# Patient Record
Sex: Female | Born: 1975 | Race: White | Hispanic: No | Marital: Married | Smoking: Never smoker
Health system: Southern US, Community
[De-identification: ages and names within clinical notes are randomized; demographics above are authoritative.]

## PROBLEM LIST (undated history)

## (undated) DIAGNOSIS — E669 Obesity, unspecified: Secondary | ICD-10-CM

## (undated) DIAGNOSIS — R0602 Shortness of breath: Secondary | ICD-10-CM

## (undated) DIAGNOSIS — R002 Palpitations: Secondary | ICD-10-CM

## (undated) DIAGNOSIS — I2541 Coronary artery aneurysm: Secondary | ICD-10-CM

## (undated) DIAGNOSIS — IMO0002 Reserved for concepts with insufficient information to code with codable children: Secondary | ICD-10-CM

## (undated) DIAGNOSIS — I071 Rheumatic tricuspid insufficiency: Secondary | ICD-10-CM

## (undated) DIAGNOSIS — I10 Essential (primary) hypertension: Secondary | ICD-10-CM

## (undated) DIAGNOSIS — K5900 Constipation, unspecified: Secondary | ICD-10-CM

## (undated) DIAGNOSIS — I517 Cardiomegaly: Secondary | ICD-10-CM

## (undated) DIAGNOSIS — R300 Dysuria: Secondary | ICD-10-CM

## (undated) DIAGNOSIS — I5032 Chronic diastolic (congestive) heart failure: Secondary | ICD-10-CM

## (undated) DIAGNOSIS — R9430 Abnormal result of cardiovascular function study, unspecified: Secondary | ICD-10-CM

## (undated) DIAGNOSIS — I272 Pulmonary hypertension, unspecified: Secondary | ICD-10-CM

## (undated) DIAGNOSIS — R635 Abnormal weight gain: Secondary | ICD-10-CM

## (undated) DIAGNOSIS — N390 Urinary tract infection, site not specified: Secondary | ICD-10-CM

## (undated) DIAGNOSIS — R42 Dizziness and giddiness: Secondary | ICD-10-CM

## (undated) DIAGNOSIS — I253 Aneurysm of heart: Secondary | ICD-10-CM

## (undated) DIAGNOSIS — F419 Anxiety disorder, unspecified: Secondary | ICD-10-CM

## (undated) HISTORY — DX: Chronic diastolic (congestive) heart failure: I50.32

## (undated) HISTORY — DX: Obesity, unspecified: E66.9

## (undated) HISTORY — PX: TUBAL LIGATION: SHX77

## (undated) HISTORY — DX: Abnormal result of cardiovascular function study, unspecified: R94.30

## (undated) HISTORY — DX: Anxiety disorder, unspecified: F41.9

## (undated) HISTORY — DX: Constipation, unspecified: K59.00

## (undated) HISTORY — DX: Rheumatic tricuspid insufficiency: I07.1

## (undated) HISTORY — DX: Coronary artery aneurysm: I25.41

## (undated) HISTORY — DX: Dizziness and giddiness: R42

## (undated) HISTORY — DX: Urinary tract infection, site not specified: N39.0

## (undated) HISTORY — DX: Pulmonary hypertension, unspecified: I27.20

## (undated) HISTORY — DX: Aneurysm of heart: I25.3

## (undated) HISTORY — DX: Abnormal weight gain: R63.5

## (undated) HISTORY — DX: Shortness of breath: R06.02

## (undated) HISTORY — DX: Reserved for concepts with insufficient information to code with codable children: IMO0002

## (undated) HISTORY — DX: Dysuria: R30.0

## (undated) HISTORY — DX: Cardiomegaly: I51.7

## (undated) HISTORY — DX: Essential (primary) hypertension: I10

## (undated) HISTORY — DX: Palpitations: R00.2

---

## 2007-06-23 ENCOUNTER — Ambulatory Visit: Payer: Self-pay | Admitting: Cardiology

## 2007-07-16 ENCOUNTER — Ambulatory Visit: Payer: Self-pay | Admitting: Cardiology

## 2007-08-11 ENCOUNTER — Ambulatory Visit: Payer: Self-pay | Admitting: Cardiology

## 2007-09-15 ENCOUNTER — Ambulatory Visit: Payer: Self-pay | Admitting: Cardiology

## 2007-11-10 ENCOUNTER — Ambulatory Visit: Payer: Self-pay | Admitting: Cardiology

## 2007-11-10 ENCOUNTER — Encounter: Payer: Self-pay | Admitting: Physician Assistant

## 2007-11-12 ENCOUNTER — Ambulatory Visit: Payer: Self-pay | Admitting: Cardiology

## 2007-11-19 ENCOUNTER — Ambulatory Visit: Payer: Self-pay | Admitting: Cardiology

## 2007-12-22 ENCOUNTER — Ambulatory Visit: Payer: Self-pay | Admitting: Cardiology

## 2007-12-29 ENCOUNTER — Encounter: Payer: Self-pay | Admitting: Cardiology

## 2008-01-15 ENCOUNTER — Ambulatory Visit: Payer: Self-pay | Admitting: Cardiology

## 2008-01-22 ENCOUNTER — Ambulatory Visit: Payer: Self-pay | Admitting: Cardiology

## 2008-01-22 ENCOUNTER — Encounter: Payer: Self-pay | Admitting: Cardiology

## 2008-02-04 ENCOUNTER — Ambulatory Visit: Payer: Self-pay | Admitting: Cardiology

## 2008-02-04 ENCOUNTER — Encounter: Payer: Self-pay | Admitting: Cardiology

## 2008-02-25 ENCOUNTER — Ambulatory Visit: Payer: Self-pay | Admitting: Cardiology

## 2008-10-24 ENCOUNTER — Encounter: Payer: Self-pay | Admitting: Cardiology

## 2009-03-30 ENCOUNTER — Encounter (INDEPENDENT_AMBULATORY_CARE_PROVIDER_SITE_OTHER): Payer: Self-pay | Admitting: *Deleted

## 2009-06-13 ENCOUNTER — Encounter: Payer: Self-pay | Admitting: Cardiology

## 2009-08-30 ENCOUNTER — Encounter (INDEPENDENT_AMBULATORY_CARE_PROVIDER_SITE_OTHER): Payer: Self-pay | Admitting: *Deleted

## 2009-08-30 ENCOUNTER — Ambulatory Visit: Payer: Self-pay | Admitting: Cardiology

## 2009-08-31 ENCOUNTER — Ambulatory Visit: Payer: Self-pay | Admitting: Cardiology

## 2009-09-01 ENCOUNTER — Ambulatory Visit: Payer: Self-pay | Admitting: Cardiology

## 2009-09-01 ENCOUNTER — Encounter (INDEPENDENT_AMBULATORY_CARE_PROVIDER_SITE_OTHER): Payer: Self-pay | Admitting: *Deleted

## 2009-09-01 ENCOUNTER — Telehealth (INDEPENDENT_AMBULATORY_CARE_PROVIDER_SITE_OTHER): Payer: Self-pay | Admitting: *Deleted

## 2009-09-04 ENCOUNTER — Encounter: Payer: Self-pay | Admitting: Cardiology

## 2009-09-05 ENCOUNTER — Ambulatory Visit: Payer: Self-pay | Admitting: Cardiovascular Disease

## 2009-09-05 ENCOUNTER — Encounter: Payer: Self-pay | Admitting: Cardiology

## 2009-09-05 ENCOUNTER — Ambulatory Visit (HOSPITAL_COMMUNITY): Admission: RE | Admit: 2009-09-05 | Discharge: 2009-09-05 | Payer: Self-pay | Admitting: Cardiology

## 2009-09-06 ENCOUNTER — Telehealth (INDEPENDENT_AMBULATORY_CARE_PROVIDER_SITE_OTHER): Payer: Self-pay | Admitting: *Deleted

## 2009-09-07 ENCOUNTER — Encounter: Payer: Self-pay | Admitting: Cardiology

## 2009-09-08 ENCOUNTER — Telehealth (INDEPENDENT_AMBULATORY_CARE_PROVIDER_SITE_OTHER): Payer: Self-pay | Admitting: *Deleted

## 2009-09-11 ENCOUNTER — Encounter (INDEPENDENT_AMBULATORY_CARE_PROVIDER_SITE_OTHER): Payer: Self-pay | Admitting: *Deleted

## 2009-09-11 ENCOUNTER — Telehealth (INDEPENDENT_AMBULATORY_CARE_PROVIDER_SITE_OTHER): Payer: Self-pay | Admitting: *Deleted

## 2009-09-14 ENCOUNTER — Encounter: Payer: Self-pay | Admitting: Cardiology

## 2009-09-15 ENCOUNTER — Encounter (INDEPENDENT_AMBULATORY_CARE_PROVIDER_SITE_OTHER): Payer: Self-pay | Admitting: *Deleted

## 2009-09-15 ENCOUNTER — Ambulatory Visit: Payer: Self-pay | Admitting: Cardiology

## 2009-09-28 ENCOUNTER — Ambulatory Visit: Payer: Self-pay | Admitting: Cardiology

## 2009-09-28 ENCOUNTER — Encounter (INDEPENDENT_AMBULATORY_CARE_PROVIDER_SITE_OTHER): Payer: Self-pay | Admitting: *Deleted

## 2009-10-20 ENCOUNTER — Encounter (INDEPENDENT_AMBULATORY_CARE_PROVIDER_SITE_OTHER): Payer: Self-pay | Admitting: *Deleted

## 2009-10-20 ENCOUNTER — Ambulatory Visit: Payer: Self-pay | Admitting: Cardiology

## 2010-04-10 ENCOUNTER — Encounter: Payer: Self-pay | Admitting: Cardiology

## 2010-04-11 ENCOUNTER — Encounter: Payer: Self-pay | Admitting: Cardiology

## 2010-04-12 ENCOUNTER — Encounter: Payer: Self-pay | Admitting: Cardiology

## 2010-05-30 ENCOUNTER — Encounter: Payer: Self-pay | Admitting: Cardiology

## 2010-05-31 ENCOUNTER — Encounter: Payer: Self-pay | Admitting: Cardiology

## 2010-06-01 ENCOUNTER — Encounter: Payer: Self-pay | Admitting: Cardiology

## 2010-08-23 ENCOUNTER — Telehealth: Payer: Self-pay | Admitting: Cardiology

## 2010-08-29 ENCOUNTER — Encounter: Payer: Self-pay | Admitting: Cardiology

## 2010-08-29 ENCOUNTER — Encounter (INDEPENDENT_AMBULATORY_CARE_PROVIDER_SITE_OTHER): Payer: Self-pay | Admitting: *Deleted

## 2010-08-29 ENCOUNTER — Ambulatory Visit
Admission: RE | Admit: 2010-08-29 | Discharge: 2010-08-29 | Payer: Self-pay | Source: Home / Self Care | Attending: Cardiology | Admitting: Cardiology

## 2010-08-31 ENCOUNTER — Encounter (INDEPENDENT_AMBULATORY_CARE_PROVIDER_SITE_OTHER): Payer: Self-pay | Admitting: *Deleted

## 2010-08-31 ENCOUNTER — Encounter: Payer: Self-pay | Admitting: Cardiology

## 2010-09-04 NOTE — Assessment & Plan Note (Signed)
Summary: ROV   Visit Type:  Follow-up Primary Provider:  Dr. Meredith Mody   History of Present Illness: The patient is seen for followup of severe hypertension, left ventricular dysfunction, shortness of breath.  Since her last visit she underwent cardiac CT angiography.  This was done at [facility].  I was there at the time of the study.  We have pushed off her beta blocker dose orally.  She then received additional total of 10 mg of IV Lopressor as the study was being done.  Images were obtained with a rate of approximately 70-75 beats per minute.  The study showed no significant coronary disease.  There was severe left ventricular hypertrophy.  Calcium score was zero.  She had normal right dominant coronary artery and no fistula.  There was no pulmonary embolus.  The coronary arteries were of normal size despite the clinical history of pulmonary hypertension.  Her aorta was of normal caliber.  Today she returns feeling somewhat better.  I believe a lot of this has to do with her blood pressure.  Preventive Screening-Counseling & Management  Alcohol-Tobacco     Smoking Status: never  Current Medications (verified): 1)  Hydrocodone-Acetaminophen 5-500 Mg Tabs (Hydrocodone-Acetaminophen) .... As Needed Migraine 2)  Clonidine Hcl 0.2 Mg Tabs (Clonidine Hcl) .... Take 1 Tablet By Mouth Three Times A Day 3)  Furosemide 20 Mg Tabs (Furosemide) .... Take One Tablet By Mouth As Needed 4)  Metoprolol Tartrate 100 Mg Tabs (Metoprolol Tartrate) .... Take 1 1/2 Tabs (150mg ) Two Times A Day (Place On File) 5)  Amlodipine Besylate 10 Mg Tabs (Amlodipine Besylate) .... Take 1 Tablet By Mouth Once A Day  Allergies (verified): 1)  ! Pcn 2)  ! Sulfa  Comments:  Nurse/Medical Assistant: The patient's medications and allergies were reviewed with the patient and were updated in the Medication and Allergy Lists. Bottles reviewed.  Past History:  Past Medical History: HYPERTENSION, UNSPECIFIED  (ICD-401.9) Overweight Migraine headaches anxiety disorder Palpitations EF 35-40%... echo... August 31, 2009 /  cardiac CTA  EF  51%...09/05/2009 LVH.. moderate... echo... January, 2011 Tricuspid regurgitation... moderate... echo.. January, 2011 Pulmonary hypertension.... 60 mmHg... echo... 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  /   ..cardiac CTA...09/05/2009...no coronary fistula  Review of Systems       Patient denies fever, chills, headache, sweats, rash, change in vision, change in hearing, chest pain, cough, nausea vomiting, urinary symptoms, musculoskeletal problems. All other systems are reviewed and are negative.  Vital Signs:  Patient profile:   35 year old female Height:      63 inches Weight:      219 pounds Pulse rate:   84 / minute BP sitting:   186 / 138  (left arm) Cuff size:   large  Vitals Entered By: Carlye Grippe (September 15, 2009 1:10 PM)  Serial Vital Signs/Assessments:  Time      Position  BP       Pulse  Resp  Temp     By           L Arm     175/119                        Carlye Grippe 1:14 PM   R Arm     176/135  Carlye Grippe    Physical Exam  General:  The patient is stable.  She is overweight. Head:  head is atraumatic. Eyes:  eyes reveal no xanthelasma. Neck:  no jugular venous distention. Chest Wall:  no chest wall tenderness. Lungs:  lungs are clear.  Respiratory effort is nonlabored. Heart:  cardiac exam reveals S1-S2.  No clicks or significant murmurs. Abdomen:  abdomen is soft. Msk:  no musculoskeletal deformities. Extremities:  no peripheral edema. Skin:  no skin rashes. Psych:  patient is oriented to person time and place.  Affect is normal.   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   We'll pulmonary arteries were normal by CT angiography.  We will follow his question over time.  Right heart catheter is to be considered but not yet.  Problem # 2:  * CORONARY ARTERY FISTULA  ????? CT angiography revealed no evidence of a coronary fistula.  This will require no further workup.  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  I have reviewed his echo personally.  There is no question at that time that she had some LV dysfunction.  At the time of her CT angiogram EF was estimated at 50% which was better.  I wonder if this patient has intermittent LV dysfunction as a function of her blood pressure.  Problem # 4:  OVERWEIGHT (ICD-278.02) I believe that losing weight could have played a major role in her overall improvement.  We have discussed this.  Problem # 5:  HYPERTENSION, UNSPECIFIED (ICD-401.9) Patient's blood pressure continues to be a real dilemma.  She is checking her pressure at home.  Her diastolic was in the range of 100 which is actually good for her.  Her heart rate is 84 on metoprolol 100 b.i.d.  She is not currently on an ACE inhibitor.  With some LV dysfunction this would be recommended.  I believe we have had significant improvement with the dosing change in her beta blocker.  I prefer to change only one minute at a time.  Therefore her Lopressor we push 150 b.i.d. I will I will see her back in the office soon.  I will then consider the addition of an ACE inhibitor.  I have spent an extensive amount of time reviewing all of her data in her problems.  I have reviewed the hospital CT angiography report.  Patient Instructions: 1)  Increase Metoprolol to 150mg  two times a day  2)  Follow up in 2 weeks.   Prescriptions: METOPROLOL TARTRATE 100 MG TABS (METOPROLOL TARTRATE) take 1 1/2 tabs (150mg ) two times a day (place on file)  #90 x 6   Entered by:   Hoover Brunette, LPN   Authorized by:   Talitha Givens, MD, [facility]   Signed by:   Hoover Brunette, LPN on 16/05/9603   Method used:   [facility]        [facility]  [address] [phone][facility] (retail)       [address]       [place], [place]  [address]       Ph: [phone]       Fax: [phone]   RxID:   [id number]

## 2010-09-04 NOTE — Letter (Signed)
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 [address]   [place], [place] [address]   Phone: [phone]  Fax: [phone]    08/30/2009  TO: [name] IT MAY CONCERN   RE: [name] [address] [phone]   The above named individual is under my medical care and may return to work UX:[id number] appointment 09/01/09.   If you have any further questions or need additional information, please call.     Sincerely,  Tinnie Gens [facility]   Appended Document: Return to Work patient seen for evaluation of bloodpressure.

## 2010-09-04 NOTE — Miscellaneous (Signed)
  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

## 2010-09-04 NOTE — Progress Notes (Signed)
Summary: NEED WORK NOTE EXTENDED TILL TOMORROW  Phone Note Call from Patient Call back at Home Phone [phone]   [name]: Patient Call For: NURSE Summary of Call: patient unable to return to work today since she didn't have note before today and would like for work excuse to be extended through today and return on tomorrow. Nurse requested from MD,speaking with [facility] Nurse.  fax number for note is [phone] att: Allie Dimmer. Initial call taken by: Carlye Grippe,  September 11, 2009 2:13 PM  Follow-up for Phone Call        MD okayed return to work tomorrow. Follow-up by: Carlye Grippe,  September 11, 2009 2:39 PM

## 2010-09-04 NOTE — Assessment & Plan Note (Signed)
Summary: 2 WK FU -SRS   Visit Type:  Follow-up Primary Provider:  Dr. Meredith Mody  CC:  hypertension.  History of Present Illness: The patient is seen today for followup of severe hypertension.  She is doing remarkably well.  We increased her beta blocker from metoprolol 100 b.i.d. to 150 b.i.d.  I cannot tell if this is the reason for the change in her blood pressure is significantly improved.  She been working hard at work.  She is feeling better in general.  Preventive Screening-Counseling & Management  Alcohol-Tobacco     Smoking Status: never  Current Medications (verified): 1)  Hydrocodone-Acetaminophen 5-500 Mg Tabs (Hydrocodone-Acetaminophen) .... As Needed Migraine 2)  Clonidine Hcl 0.2 Mg Tabs (Clonidine Hcl) .... Take 1 Tablet By Mouth Three Times A Day 3)  Furosemide 20 Mg Tabs (Furosemide) .... Take One Tablet By Mouth As Needed 4)  Metoprolol Tartrate 100 Mg Tabs (Metoprolol Tartrate) .... Take 1 1/2 Tabs (150mg ) Two Times A Day (Place On File) 5)  Amlodipine Besylate 10 Mg Tabs (Amlodipine Besylate) .... Take 1 Tablet By Mouth Once A Day  Allergies (verified): 1)  ! Pcn 2)  ! Sulfa  Comments:  Nurse/Medical Assistant: The patient's medications and allergies were reviewed with the patient and were updated in the Medication and Allergy Lists. Verbally gave names.  Past History:  Past Medical History: Last updated: 09/15/2009 HYPERTENSION, UNSPECIFIED (ICD-401.9) Overweight Migraine headaches anxiety disorder Palpitations EF 35-40%... echo... August 31, 2009 /  cardiac CTA  EF  51%...09/05/2009 LVH.. moderate... echo... January, 2011 Tricuspid regurgitation... moderate... echo.. January, 2011 Pulmonary hypertension.... 60 mmHg... echo... 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  /   ..cardiac CTA...09/05/2009...no coronary fistula  Review of Systems       Patient denies fever, chills, headache, sweats,  rash, change in vision, change in hearing, chest pain, cough, nausea vomiting, urinary symptoms all other systems are reviewed and are negative.  Vital Signs:  Patient profile:   35 year old female Height:      63 inches Weight:      222 pounds Pulse rate:   70 / minute BP sitting:   143 / 83  (left arm) Cuff size:   large  Vitals Entered By: Carlye Grippe (September 28, 2009 3:04 PM) CC: hypertension   Physical Exam  General:  patient is quite stable in general. Eyes:  no xanthelasma. Neck:  no jugular venous distention. Lungs:  lungs are clear.  Respiratory effort is not labored. Abdomen:  abdomen is soft. Extremities:  no peripheral edema. Psych:  patient is oriented to person time and place.  Affect is normal.   Impression & Recommendations:  Problem # 1:  PULMONARY HYPERTENSION (ICD-416.8) Patient will have followup studies over time.  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  Problem # 3:  HYPERTENSION, UNSPECIFIED (ICD-401.9)  Her updated medication list for this problem includes:    Clonidine Hcl 0.2 Mg Tabs (Clonidine hcl) .Marland Kitchen... Take 1 tablet by mouth three times a day    Furosemide 20 Mg Tabs (Furosemide) .Marland Kitchen... Take one tablet by mouth as needed    Metoprolol Tartrate 100 Mg Tabs (Metoprolol tartrate) .Marland Kitchen... Take 1 1/2 tabs (150mg ) two times a day (place on file)    Amlodipine Besylate 10 Mg Tabs (Amlodipine besylate) .Marland Kitchen... Take 1 tablet by mouth once a day  The patient's blood pressure looks quite good today.  We will keep our fingers crossed if this continues.  Still unsure exactly why he would be improved with just a change in the beta blocker dose.  Patient Instructions: 1)  Your physician recommends that you schedule a follow-up appointment in: 66month. 2)  Your physician recommends that you continue on your current medications as directed. Please refer to the Current  Medication list given to you today.

## 2010-09-04 NOTE — Assessment & Plan Note (Signed)
Summary: EST-LAST SEEN 2009   Visit Type:  Follow-up Primary Provider:  Dr. Meredith Mody  CC:  hypertension.  History of Present Illness: The patient is seen for followup of severe hypertension.  I saw her last in June, 2009.  She tells me that for a period of time her blood pressure was under control.  She hospitalized in January 2010 with some pain like irritable bowel.  She says that she lost a lot of weight and that her blood pressure was under control.  In the past months she has started to feel poorly again.  She has some shortness of breath.  She is gaining weight.  She tells me his sleep study had been done and that showed no significant abnormalities.  She's had some mild palpitations.  She feels that her abdomen is bloated.  She has mild edema.  As outlined in my note of January 15, 2008, the patient has been evaluated extensively in the past.  We have never completely understood her blood pressure.  Patient has been assessed at [facility] in the past.  In January of 2006 she had an arteriogram showing normal renal arteries area she had normal renin levels, Elavil levels, and screening for pheochromocytoma and Cushing's disease.  These were all negative area CT of the abdomen had shown no adrenal mass.  Over time there has been some question of compliance.  Preventive Screening-Counseling & Management  Alcohol-Tobacco     Smoking Status: never  Current Medications (verified): 1)  Hydrocodone-Acetaminophen 5-500 Mg Tabs (Hydrocodone-Acetaminophen) .... As Needed 2)  Clonidine Hcl 0.3 Mg Tabs (Clonidine Hcl) .... Take 1 Tablet By Mouth Twice A Day 3)  Furosemide 20 Mg Tabs (Furosemide) .... Take One Tablet By Mouth As Needed 4)  Metoprolol Tartrate 50 Mg Tabs (Metoprolol Tartrate) .... Take One Tablet By Mouth Twice A Day  Allergies: 1)  ! Pcn 2)  ! Sulfa  Comments:  Nurse/Medical Assistant: The patient's medications were reviewed with the patient and were updated in the Medication List.  Pt brought medication bottles to office visit.  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  She does have some shortness of breath.  She feels a mild palpitations.  All other systems are reviewed and are negative.  Vital Signs:  Patient profile:   35 year old female Height:      63 inches Weight:      221.75 pounds BMI:     39.42 Pulse rate:   116 / minute BP sitting:   221 / 157  (right arm) Cuff size:   large  Vitals Entered By: Cyril Loosen, RN, BSN (August 30, 2009 2:31 PM)  Nutrition Counseling: Patient's BMI is greater than 25 and therefore counseled on weight management options. CC: hypertension Comments Follow up visit. Pt states she's having a lot of SOB especially with climbing steps. States she had period of abd swelling and pain in the middle of her shoulder blades in her back. She states her BP has been high also    Physical Exam  General:  The patient is stable but overweight. Head:  head is atraumatic. Eyes:  no xanthelasma. Abdomen:  the abdomen is protuberant but soft. Msk:  no musculoskeletal deformities. Extremities:  there is trace peripheral edema in her left ankle. Skin:  no skin rashes. Psych:  patient is oriented to person time and place.  Affect is normal.   Impression & Recommendations:  Problem # 1:  PALPITATIONS, HX OF (ICD-V12.50)  Orders: EKG w/ Interpretation (93000)  EKG is done today and reviewed by me.  She has left ventricular hypertrophy and mild sinus tachycardia.  Problem # 2:  OVERWEIGHT (ICD-278.02) By history the only time that her blood pressure has been reasonable his when she lost some weight.  It  is difficult to believe that her marked hypertension his weight dependent.  Overweight loss would be very important. This point I doubt that there is an unusual cause of weight gain that has caused her to gain weight recently.  Problem # 3:  HYPERTENSION, UNSPECIFIED (ICD-401.9) The patient has severe hypertension.  She does not have any signs of malignant hypertension.  He tells me that labs checked recently by Dr.Park showed normal chemistries and blood count.  She said that her thyroid test was normal.  The etiology of his blood pressure elevation remains unclear.  Because prior workups have not been revealing I am not inclined to push for complete redoing of all of her studies.  We will add amlodipine 10 mg to her current meds.  I will change the frequency of her clonidine to make it a little easier for her to take.  Problem # 4:  SHORTNESS OF BREATH (ICD-786.05)  The patient does not appear to be markedly fluid overloaded at this time.  I will not increase her diuretic dose as of today. The patient will have a chest x-ray to further assess her lungs.  Two-dimensional echo will be done to reassess LV function.  We will obtain copies of her labs from Dr. Willaim Bane.  Orders: T-Chest x-ray, 2 views (04540) 2-D Echocardiogram (2D Echo)  Patient Instructions: 1)  A chest x-ray takes a picture of the organs and structures inside the chest, including the heart, lungs, and blood vessels. This test can show several things, including, whether the heart is enlarged; whether fluid is building up in the lungs; and whether pacemaker / defibrillator leads are still in place. 2)  Your physician has requested that you have an echocardiogram.  Echocardiography is a painless test that uses sound waves to create images of your heart. It provides your doctor with information about the size and shape of your heart and how well your heart's chambers and valves are working.  This procedure takes approximately one hour. There  are no restrictions for this procedure. 3)  Your physician has recommended you make the following change in your medication: START AMLODIPINE 10MG  DAILY. 4)  Your physician recommends that you schedule a follow-up appointment in: Friday 28th 2011 @11 :30am with Dr. Myrtis Ser. Prescriptions: AMLODIPINE BESYLATE 10 MG TABS (AMLODIPINE BESYLATE) Take 1 tablet by mouth once a day  #30 x 6   Entered by:   Carlye Grippe   Authorized by:   Talitha Givens, MD, [facility]   Signed by:   Carlye Grippe on 08/30/2009   Method used:   [facility]  [address] [phone][facility]ail)       [address]       [place], [place]  [address]       Ph: [phone]       Fax: [phone]   RxID:   [id number]

## 2010-09-04 NOTE — Assessment & Plan Note (Signed)
Summary: PER DR. Jeanie Mccard   Visit Type:  Follow-up Primary Provider:  Dr. Meredith Mody  CC:  shortness of breath / hypertension.  History of Present Illness: The patient is seen for followup of severe hypertension and some shortness of breath.  See my complete note from January 26, 201.  Amlodipine was added to her meds.  She is tolerating this.  Meds lactulose.  There is evidence of left ventricular dysfunction with an ejection fraction 35-40%.  There is left ventricular hypertrophy.  There is moderately severe pulmonary hypertension.  There is a systolic jet at the level of the right ventricular outflow tract that may represent a coronary fistula.  This will need to be assessed further.    Preventive Screening-Counseling & Management  Alcohol-Tobacco     Smoking Status: never  Current Medications (verified): 1)  Hydrocodone-Acetaminophen 5-500 Mg Tabs (Hydrocodone-Acetaminophen) .... As Needed 2)  Clonidine Hcl 0.3 Mg Tabs (Clonidine Hcl) .... Take 1 Tablet By Mouth Twice A Day 3)  Furosemide 20 Mg Tabs (Furosemide) .... Take One Tablet By Mouth As Needed 4)  Metoprolol Tartrate 50 Mg Tabs (Metoprolol Tartrate) .... Take One Tablet By Mouth Twice A Day 5)  Amlodipine Besylate 10 Mg Tabs (Amlodipine Besylate) .... Take 1 Tablet By Mouth Once A Day  Allergies: 1)  ! Pcn 2)  ! Sulfa  Comments:  Nurse/Medical Assistant: The patient's medications were reviewed with the patient and were updated in the Medication List. Pt verbally confirmed medications.  Cyril Loosen, RN, BSN (September 01, 2009 11:29 AM)  Past History:  Past Medical History: HYPERTENSION, UNSPECIFIED (ICD-401.9) OBESITY-MORBID (>100') (ICD-278.01) Migraine headaches anxiety disorder Overweight Palpitations EF 35-40%... echo... August 31, 2009 LVH.. moderate... echo... January, 2011 Tricuspid regurgitation... moderate... echo.. January, 2011 Pulmonary hypertension.... 60 mmHg... echo... January, 2011 Coronary  fistula ???  this needs to be ruled out... echo... January, 2011.... systolic jet in the right ventricle outflow tract and proximal pulmonary artery.... CT angiogram of the heart to be arranged...  Review of Systems       Patient denies fever, chills, headache, sweats, rash, change in vision, change in hearing, chest pain, cough, nausea vomiting, urinary symptoms.  All other systems are reviewed and are negative.  Vital Signs:  Patient profile:   35 year old female Height:      63 inches Weight:      219.75 pounds Pulse rate:   97 / minute BP sitting:   186 / 144  (left arm) Cuff size:   large  Vitals Entered By: Cyril Loosen, RN, BSN (September 01, 2009 11:28 AM) CC: shortness of breath / hypertension Comments Follow up blood pressure and echo results.    Physical Exam  General:  patient is stable today in general.she is overweight. Head:  head is atraumatic. Eyes:  no xanthelasma. Neck:  no jugular venous distention. Chest Wall:  chest wall not tender. Lungs:  lungs are clear.  Respiratory effort is nonlabored. Heart:  cardiac exam reveals S1-S2 and a soft systolic murmur. Abdomen:  abdomen is soft. Msk:  no musculoskeletal deformities. Extremities:  no peripheral edema. Skin:  no skin rashes. Psych:  patient is oriented to person time and place.  Affect is normal.   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  This will be evaluated fully.  Orders: CT Scan with Contrast (CTw/contrast) T-Basic Metabolic Panel ([id number])  Problem # 2:  *  CORONARY ARTERY FISTULA  ?????  As outlined in the history of present illness there is question on the current echocardiogram if the patient could have a coronary artery fistula.  The patient left base cardiac CT angiogram to fully assess the anatomy of her coronary arteries.  With this information we will then  proceed with further studies.  Orders: CT Scan with Contrast (CTw/contrast)  Problem # 3:  EF 35-40% (ICD-278.02)  as outlined in the history of present illness the echo lining this show a new finding of decreased left ventricular function.  Meds will be adjusted further and this issue will be assessed further after she has the CT angiogram.  Orders: CT Scan with Contrast (CTw/contrast)  Problem # 4:  HYPERTENSION, UNSPECIFIED (ICD-401.9)  Her updated medication list for this problem includes:    Clonidine Hcl 0.2 Mg Tabs (Clonidine hcl) .Marland Kitchen... Take 1 tablet by mouth three times a day    Furosemide 20 Mg Tabs (Furosemide) .Marland Kitchen... Take one tablet by mouth as needed    Metoprolol Tartrate 100 Mg Tabs (Metoprolol tartrate) .Marland Kitchen... Take 1 tablet by mouth two times a day    Amlodipine Besylate 10 Mg Tabs (Amlodipine besylate) .Marland Kitchen... Take 1 tablet by mouth once a day  Patient's hypertension is severe.  As outlined multiple times in the past it has been very difficult tounderstand why she has variations in her blood pressures.  Today her metoprolol dose will be increased.  Careful attention to the next steps for her blood pressure treatment.  Orders: CT Scan with Contrast (CTw/contrast)  Patient Instructions: 1)  Your physician has recommended you make the following change in your medication: CHANGED CLONIDINE TO 0.2MG  THREE TIMES DAILY. METOPROLOL TARTRATE CHANGED TO 100MG  TWICE DAILY. You may take (2) of the 50mg  twice daily till they're gone then start your new prescription. 2)  Your physician recommends that you return for lab work ZO:XWRUE AT [facility] FOR BMET. 3)  Your physician has requested that you have a cardiac CT.  Cardiac computed tomography (CT) is a painless test that uses an x-ray machine to take clear, detailed pictures of your heart.  For further information please visit [email or web address]  Please follow instruction sheet as given. 4)  Your follow up appointment will be  determined after your test. Prescriptions: METOPROLOL TARTRATE 100 MG TABS (METOPROLOL TARTRATE) Take 1 tablet by mouth two times a day  #60 x 3   Entered by:   Carlye Grippe   Authorized by:   Talitha Givens, MD, [facility]   Signed by:   Carlye Grippe on 09/01/2009   Method used:   Electronically to        [facility]  [address] [phone]* (retail)       [address]       [place], [place]  [address]       Ph: [phone]       Fax: [phone]   RxID:   [id number] CLONIDINE HCL 0.2 MG TABS (CLONIDINE HCL) Take 1 tablet by mouth three times a day  #90 x 3   Entered by:   Carlye Grippe   Authorized by:   Talitha Givens, MD, [facility]   Signed by:   Carlye Grippe on 09/01/2009   Method used:   [facility]        [facility]  [address] [phone]* (retail)       [address]       [place], [place]  [address]  Ph: [phone]       Fax: [phone]   RxID:   [id number]

## 2010-09-04 NOTE — Letter (Signed)
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 [address]   [place], [place] [address]   Phone: [phone]  Fax: [phone]    09/11/2009  TO: [name] IT MAY CONCERN   RE: [name] [address] [phone]   The above named individual is under my medical care and may return to work on:09/12/09 without restrictions.  If you have any further questions or need additional information, please call.     Sincerely,  Tinnie Gens Katz,MD F.A.C.C.  Carlye Grippe LPN  Appended Document: Return to Work faxed.  Appended Document: Return to Work Patient informed of the above via message machine.

## 2010-09-04 NOTE — Letter (Signed)
Summary: Work Writer at [employer]	[place]. [address]   [place], [place] [address]   Phone: [phone]  Fax: [phone]     September 15, 2009    [name]   The above named patient had a medical visit today at:  1:15  pm.  Please take this into consideration when reviewing the time away from work/school.      Sincerely yours,  Architectural technologist

## 2010-09-04 NOTE — Letter (Signed)
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 [address]   [place], [place] [address]   Phone: [phone]  Fax: [phone]    09/01/2009  TO: [name] IT MAY CONCERN   RE: [name] [address] [phone]   The above named individual is under my medical care including hypertension and may not return to work until further notice.  If you have any further questions or need additional information, please call.     Sincerely,   Effie Shy [facility] Carlye Grippe LPN

## 2010-09-04 NOTE — Progress Notes (Signed)
Summary: Wants test results  Phone Note Call from Patient Call back at Home Phone [phone]   Summary of Call: Pt called regarding results of Cardiac CT. Notified pt that report is available but Dr. Myrtis Ser has not reviewed it yet. We will notify her of results once reviewed by Dr. Myrtis Ser. Initial call taken by: Cyril Loosen, RN, BSN,  September 06, 2009 3:32 PM     Appended Document: Wants test results patient informed that we would contact her as soon as MD addresses results.  Appended Document: Wants test results FEB 11TH @1 :15  Appended Document: Wants test results Patient informed of the above.

## 2010-09-04 NOTE — Progress Notes (Signed)
Summary: LAB REQUEST  Phone Note Outgoing Call   Call placed by: Carlye Grippe,  September 01, 2009 12:27 PM Call placed to: [facility] office Summary of Call: spoke with Darl Pikes and request labs again and she said she would faxed them over. Initial call taken by: Carlye Grippe,  September 01, 2009 12:28 PM

## 2010-09-04 NOTE — Letter (Signed)
Summary: REQUEST FOR CARDIAC CT ORDERS  REQUEST FOR CARDIAC CT ORDERS   Imported By: Zachary George 09/04/2009 09:02:39  _____________________________________________________________________  External Attachment:    Type:   Image     Comment:   External Document

## 2010-09-04 NOTE — Letter (Signed)
Summary: Work Writer at [employer]	[place]. [address]   [place], [place] [address]   Phone: [phone]  Fax: [phone]     October 20, 2009    [name]   The above named patient had a medical visit today at:  1:00 .  She was seen today for evaluation and treatment of high blood pressure.  Please take this into consideration when reviewing the time away from work/school.      Sincerely yours,  Architectural technologist

## 2010-09-04 NOTE — Assessment & Plan Note (Signed)
Summary: 1 mof u -srs   Visit Type:  Follow-up Primary Provider:  Dr. Meredith Mody  CC:  hypertension.  History of Present Illness: The patient is seen for followup of hypertension and cardiomyopathy.  She is doing well.  She remains motivated to try to lose weight.  It has been difficult for her to get the weight watchers because the meeting time starts at the same time that she leaves work.  She is taking her medications reliably.  Her blood pressure is mildly elevated for her today but I am not inclined to change her medicines are in  Preventive Screening-Counseling & Management  Alcohol-Tobacco     Smoking Status: never  Current Medications (verified): 1)  Hydrocodone-Acetaminophen 5-500 Mg Tabs (Hydrocodone-Acetaminophen) .... As Needed Migraine 2)  Clonidine Hcl 0.2 Mg Tabs (Clonidine Hcl) .... Take 1 Tablet By Mouth Three Times A Day 3)  Furosemide 20 Mg Tabs (Furosemide) .... Take One Tablet By Mouth As Needed 4)  Metoprolol Tartrate 100 Mg Tabs (Metoprolol Tartrate) .... Take 1 1/2 Tabs (150mg ) Two Times A Day (Place On File) 5)  Amlodipine Besylate 10 Mg Tabs (Amlodipine Besylate) .... Take 1 Tablet By Mouth Once A Day  Allergies: 1)  ! Pcn 2)  ! Sulfa  Comments:  Nurse/Medical Assistant: The patient's medications were reviewed with the patient and were updated in the Medication List. Pt verbally confirmed medications.  Cyril Loosen, RN, BSN (October 20, 2009 1:06 PM)  Past History:  Past Medical History: Last updated: 09/15/2009 HYPERTENSION, UNSPECIFIED (ICD-401.9) Overweight Migraine headaches anxiety disorder Palpitations EF 35-40%... echo... August 31, 2009 /  cardiac CTA  EF  51%...09/05/2009 LVH.. moderate... echo... January, 2011 Tricuspid regurgitation... moderate... echo.. January, 2011 Pulmonary hypertension.... 60 mmHg... echo... 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  /   ..cardiac  CTA...09/05/2009...no coronary fistula  Review of Systems       Patient denies fever, chills, headache, sweats, rash, change in vision, change in hearing, chest pain, cough, shortness of breath, nausea vomiting, urinary symptoms.  All other systems are reviewed and are negative.  Vital Signs:  Patient profile:   35 year old female Height:      63 inches Weight:      222.50 pounds BMI:     39.56 Pulse rate:   67 / minute BP sitting:   156 / 104  (left arm) Cuff size:   large  Vitals Entered By: Cyril Loosen, RN, BSN (October 20, 2009 1:04 PM)  Nutrition Counseling: Patient's BMI is greater than 25 and therefore counseled on weight management options. CC: hypertension Comments follow up on blood pressure   Physical Exam  General:  Patient is awake and stable. Eyes:  no xanthelasma. Neck:  no jugulovenous attention. Lungs:  lungs are clear respiratory effort is nonlabored. Heart:  cardiac exam reveals S1-S2.  No clicks or significant murmurs. Abdomen:  abdomen soft. Msk:  no muscular skeletal deformities. Extremities:  no peripheral edema. Psych:  patient is oriented to person time and place.  Affect is normal.   Impression & Recommendations:  Problem # 1:  HYPERTENSION, UNSPECIFIED (ICD-401.9)  We will arrange for follow up echo over time.  Her updated medication list for this problem includes:    Clonidine Hcl 0.2 Mg Tabs (Clonidine hcl) .Marland Kitchen... Take 1 tablet by mouth three times a day    Furosemide 20 Mg Tabs (Furosemide) .Marland Kitchen... Take one tablet by mouth as needed  Metoprolol Tartrate 100 Mg Tabs (Metoprolol tartrate) .Marland Kitchen... Take 1 1/2 tabs (150mg ) two times a day (place on file)    Amlodipine Besylate 10 Mg Tabs (Amlodipine besylate) .Marland Kitchen... Take 1 tablet by mouth once a day Medications will be continued.  All see her for follow up.  We will consider further adjustments of her meds at her next visit.  Problem # 2:  SHORTNESS OF BREATH (ICD-786.05)  Her updated medication  list for this problem includes:    Furosemide 20 Mg Tabs (Furosemide) .Marland Kitchen... Take one tablet by mouth as needed    Metoprolol Tartrate 100 Mg Tabs (Metoprolol tartrate) .Marland Kitchen... Take 1 1/2 tabs (150mg ) two times a day (place on file)    Amlodipine Besylate 10 Mg Tabs (Amlodipine besylate) .Marland Kitchen... Take 1 tablet by mouth once a day Patient is not having shortness of breath at this time.  Same therapy.  Patient Instructions: 1)  Your physician recommends that you schedule a follow-up appointment in: six weeks. we will call you when scheduled. 2)  Your physician recommends that you continue on your current medications as directed. Please refer to the Current Medication list given to you today.

## 2010-09-04 NOTE — Miscellaneous (Signed)
  Clinical Lists Changes  Observations: Added new observation of PAST MED HX: HYPERTENSION, UNSPECIFIED (ICD-401.9) Overweight Migraine headaches anxiety disorder Palpitations EF 35-40%... echo... August 31, 2009 /  cardiac CTA  EF  51%...09/05/2009 LVH.. moderate... echo... January, 2011 Tricuspid regurgitation... moderate... echo.. January, 2011 Pulmonary hypertension.... 60 mmHg... echo... January, 2011 Coronary fistula ??? by echo 08/1999....cardiac CTA...09/05/2009...no coronary fistula (09/14/2009 13:42) Added new observation of PRIMARY MD: Dr. Meredith Mody (09/14/2009 13:42)       Past History:  Past Medical History: HYPERTENSION, UNSPECIFIED (ICD-401.9) Overweight Migraine headaches anxiety disorder Palpitations EF 35-40%... echo... August 31, 2009 /  cardiac CTA  EF  51%...09/05/2009 LVH.. moderate... echo... January, 2011 Tricuspid regurgitation... moderate... echo.. January, 2011 Pulmonary hypertension.... 60 mmHg... echo... January, 2011 Coronary fistula ??? by echo 08/1999....cardiac CTA...09/05/2009...no coronary fistula

## 2010-09-04 NOTE — Miscellaneous (Signed)
  Clinical Lists Changes  Observations: Added new observation of PRIMARY MD: Dr. Meredith Mody (09/14/2009 13:46)

## 2010-09-04 NOTE — Letter (Signed)
Summary: External Correspondence/ [facility]  External Correspondence/ [facility]   Imported By: Dorise Hiss 04/13/2010 10:09:08  _____________________________________________________________________  External Attachment:    Type:   Image     Comment:   External Document

## 2010-09-04 NOTE — Letter (Signed)
Summary: Work Writer at [employer]	[place]. [address]   [place], [place] [address]   Phone: [phone]  Fax: [phone]     September 28, 2009    [name]   The above named patient had a medical visit today at:  3:30  pm.  Please take this into consideration when reviewing the time away from work/school.      Sincerely yours,  Architectural technologist

## 2010-09-04 NOTE — Letter (Signed)
Summary: External Correspondence/ [facility]  External Correspondence/ [facility]   Imported By: Dorise Hiss 04/13/2010 10:16:05  _____________________________________________________________________  External Attachment:    Type:   Image     Comment:   External Document

## 2010-09-04 NOTE — Progress Notes (Signed)
Summary: need return to work note  Phone Note Call from Patient Call back at [employer] [phone]   [name]: Patient Call For: nurse Summary of Call: patient left message on machine that she was given her test results by Dr. Myrtis Ser on yesterday and also was informed that she could return to work on Monday. Patient requesting a return to work note. Initial call taken by: Carlye Grippe,  September 08, 2009 1:56 PM  Follow-up for Phone Call        she may come to office to obtain a clearance note, which can be written on a prescription pad. Follow-up by: Nelida Meuse, PA-C,  September 08, 2009 4:30 PM  Additional Follow-up for Phone Call Additional follow up Details #1::        Thank you  Talitha Givens, MD, [facility])  September 10, 2009 10:27 AM

## 2010-09-04 NOTE — Miscellaneous (Signed)
Clinical Lists Changes  Observations: Added new observation of CARDIO HPI: See my complete note of September 01, 2009.  The patient underwent CT angiography at [facility], on September 05, 2009.  I was present during that study.  There was significant difficulty finding a peripheral IV but ultimately this was done.  The patient refused to take nitroglycerin saying that it made her extremely sick in the past.  During the study she was worried that she would become nauseated from the dye.  There was some nausea but she was stable.  Despite all of this, a good study was obtained.  I had increased her metoprolol dose to 100 b.i.d.  When she came into the radiology suite her heart rate was approximately 70.  We gave her 2 doses of 2.5 mg of Lopressor followed by one dose of 5 mg of IV Lopressor.  Her heart rate remained around 68.  Her crit increased to approximately 75 at the time images were obtained.  I reviewed the data with Dr. Velora Mediate.  Patient's calcium score was zero.  There was no evidence of coronary disease.  There was no evidence of a coronary fistula.  RV function was good.  We could not absolutely rule out coarct from this and we will rereview her echo.  It appeared from this study there ejection fraction was 51% which was higher than the echo done recently.  I have reviewed her echo from [place].  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  There is also pulmonary hypertension.  I called and explained the results the patient.  She is feeling better and I will allow her to return to work.  We will schedule her appointment soon to check on her symptoms and her blood pressure.  I told her that we will be planning a right heart catheter at some point.  Consideration will be given to doing an echo when her blood pressure is controlled to see if in fact she has been her LV function with better blood pressure.  Also consideration will be given to obtaining central aortic pressure to compare to her  peripheral pressures at the time that a right heart catheter was done.  We need to know more about the bases of her pulmonary hypertens. (09/07/2009 16:14) Added new observation of PRIMARY MD: Dr. Meredith Mody (09/07/2009 16:14)      Primary Provider:  Dr. Meredith Mody   History of Present Illness: See my complete note of September 01, 2009.  The patient underwent CT angiography at [facility], on September 05, 2009.  I was present during that study.  There was significant difficulty finding a peripheral IV but ultimately this was done.  The patient refused to take nitroglycerin saying that it made her extremely sick in the past.  During the study she was worried that she would become nauseated from the dye.  There was some nausea but she was stable.  Despite all of this, a good study was obtained.  I had increased her metoprolol dose to 100 b.i.d.  When she came into the radiology suite her heart rate was approximately 70.  We gave her 2 doses of 2.5 mg of Lopressor followed by one dose of 5 mg of IV Lopressor.  Her heart rate remained around 68.  Her crit increased to approximately 75 at the time images were obtained.  I reviewed the data with Dr. Velora Mediate.  Patient's calcium score was zero.  There was no evidence of coronary disease.  There was  no evidence of a coronary fistula.  RV function was good.  We could not absolutely rule out coarct from this and we will rereview her echo.  It appeared from this study there ejection fraction was 51% which was higher than the echo done recently.  I have reviewed her echo from [place].  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  There is also pulmonary hypertension.  I called and explained the results the patient.  She is feeling better and I will allow her to return to work.  We will schedule her appointment soon to check on her symptoms and her blood pressure.  I told her that we will be planning a right heart catheter at some point.  Consideration will be given  to doing an echo when her blood pressure is controlled to see if in fact she has been her LV function with better blood pressure.  Also consideration will be given to obtaining central aortic pressure to compare to her peripheral pressures at the time that a right heart catheter was done.  We need to know more about the bases of her pulmonary hypertens.

## 2010-09-06 NOTE — Letter (Signed)
Summary: Engineer, materials at [facility]  [address]   [place], [place] [address]   Phone: [phone]  Fax: [phone]        August 31, 2010 MRN: [id number]    Tarrah Holsapple [address] [place], [place]  [address]    Dear [name],  Your test ordered by Selena Batten has been reviewed by your physician (or physician assistant) and was found to be normal or stable. Your physician (or physician assistant) felt no changes were needed at this time.  ____ Echocardiogram  ____ Cardiac Stress Test  __X__ Lab Work  ____ Peripheral vascular study of arms, legs or neck  ____ CT scan or X-ray  ____ Lung or Breathing test  ____ Other:   Thank you.   Cyril Loosen, RN, BSN    Duane Boston, M.D., F.A.C.C. Thressa Sheller, M.D., F.A.C.C. Oneal Grout, M.D., F.A.C.C. Cheree Ditto, M.D., F.A.C.C. Daiva Nakayama, M.D., F.A.C.C. Kenney Houseman, M.D., F.A.C.C. Jeanne Ivan, PA-C

## 2010-09-06 NOTE — Assessment & Plan Note (Signed)
Summary: [name]   Visit Type:  Follow-up Primary Provider:  Dr. Meredith Mody  CC:  elevated blood pressure.  History of Present Illness: The patient is seen for followup of hypertension.  She has a long and complex history.  I saw her last in the office in March, 2011.  At that time it appeared that her pressure was under reasonable control for her.  I had planned for earlier followup by she has not been to the office.  Recently she was hospitalized in [place].  I will need the records to understand the course better.  She says that that time congestive heart failure was used.  It sounds like she was volume overloaded.  However I am not sure of the specifics.  Her meds were adjusted.  Previously I had her arm clonidine, furosemide, metoprolol, and amlodipine.  She was discharged from the hospital off of Lasix.  Her clonidine was continued and she was on a lower dose of metoprolol.  She was also on hydralazine.  Hydralazine ran out and she did not refill it.  Dr. Willaim Bane and then started her on bystolic.  She's had a problem with recurrent urinary tract type symptoms.  She sees a urologist.  She is on trimethoprim Monday Wednesday Friday 10 mg.  She thinks this is helping her.  Preventive Screening-Counseling & Management  Alcohol-Tobacco     Smoking Status: never  Current Medications (verified): 1)  Hydrocodone-Acetaminophen 7.5-500 Mg Tabs (Hydrocodone-Acetaminophen) .... Take 1 Tablet By Mouth Two Times A Day As Needed Migraines 2)  Clonidine Hcl 0.2 Mg Tabs (Clonidine Hcl) .... Take 1 Tablet By Mouth Three Times A Day 3)  Metoprolol Tartrate 100 Mg Tabs (Metoprolol Tartrate) .... Take 1/2 Tablet By Mouth Three Times A Day 4)  Bystolic 5 Mg Tabs (Nebivolol Hcl) .... Take 1 Tablet By Mouth Once A Day  Allergies (verified): 1)  ! Pcn 2)  ! Sulfa  Comments:  Nurse/Medical Assistant: The patient's medication bottles and allergies were reviewed with the patient and were updated in  the Medication and Allergy Lists.  Past History:  Past Medical History: HYPERTENSION, UNSPECIFIED (ICD-401.9) Overweight Migraine headaches anxiety disorder Palpitations EF 35-40%... echo... August 31, 2009 /  cardiac CTA  EF  51%...09/05/2009 LVH.. moderate... echo... January, 2011 Tricuspid regurgitation... moderate... echo.. January, 2011 Pulmonary hypertension.... 60 mmHg... echo... 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  /   ..cardiac CTA...09/05/2009...no coronary fistula Recurrent urinary tract symptoms  Review of Systems       The patient denies fever, chills, headache, sweats, rash, change in vision, change in hearing, chest pain, cough, nausea vomiting.  All other systems are reviewed and are negative.  Vital Signs:  Patient profile:   35 year old female Height:      63 inches Weight:      223 pounds BMI:     39.65 Pulse rate:   67 / minute BP sitting:   183 / 121  (left arm) Cuff size:   large  Vitals Entered By: Carlye Grippe (August 29, 2010 1:44 PM)  Nutrition Counseling: Patient's BMI is greater than 25 and therefore counseled on weight management options.  Serial Vital Signs/Assessments:  Time      Position  BP       Pulse  Resp  Temp     By 1:47 PM             164/96   63  Carlye Grippe 1:48 PM             782/95   62                    Carlye Grippe  Comments: 1:47 PM right arm/large cuff By: Carlye Grippe  1:48 PM recheck left arm large cuff By: Carlye Grippe   CC: elevated blood pressure   Physical Exam  General:  The patient is overweight but stable. Head:  head is atraumatic. Eyes:  no xanthelasma. Neck:  no jugular venous distention. Chest Wall:  no chest wall tenderness. Lungs:  lungs are clear.  Respiratory effort is nonlabored. Heart:  cardiac exam reveals S1-S2.  There are no clicks or significant murmurs. Abdomen:  abdomen is soft. Msk:  no  musculoskeletal deformities. Extremities:  no peripheral edema. Skin:  no skin rashes. Psych:  patient is oriented to person time and place.  Affect is normal.   Impression & Recommendations:  Problem # 1:  * RECURRENT URINARY TRACT SYMPTOMS The patient is seeing urology for this.  I am assuming that she has no evidence of outflow obstruction.  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  Pulmonary arteries were normal by chest CT.  I will rereview her workup in the past.  At some point we may want to do right heart catheter  Problem # 3:  * CORONARY ARTERY FISTULA  ????? The patient's CT scan revealed no evidence of a coronary artery fistula.  Problem # 4:  CARDIOMYOPATHY (ICD-425.4) At one point the patient's ejection fraction appeared to be 35-40% by echo in January, 2011.  At the time of cardiac CTA her ejection fraction by that technique was 51%.  We will obtain data from the hospital at [facility].  Problem # 5:  SHORTNESS OF BREATH (ICD-786.05)  The patient's breathing is good today.  Orders: T-Basic Metabolic Panel [phone])  Problem # 6:  HYPERTENSION, UNSPECIFIED (ICD-401.9) The patient's blood pressure continues to be difficult to assess.  When she first came in the office today her pressure was 180/120 with a large cuff.  She was not short of breath.  After a few minutes her pressure was checked again in both arms with a large cuff.  The pressure was 164/96 on the right and 167/97 on the left.  Based on these pressures I have chosen not to change her medicines until I obtain more information from her hospitalization.  She had responded well to amlodipine in the past.  I had her on a diuretic which has been stopped.  Also I may change her clonidine to Tenex.  First I will obtain information and see her in followup.  Chemistry will also be checked today to reassess her renal function  Patient  Instructions: 1)  Follow up with Dr. Myrtis Ser on Wednesday, September 12, 2010 at 10:45am. 2)  Your physician recommends that you go to the [facility] for lab work. 3)  Your physician recommends that you continue on your current medications as directed. Please refer to the Current Medication list given to you today.

## 2010-09-06 NOTE — Letter (Signed)
Summary: Discharge [facility]  Discharge [facility]   Imported By: Dorise Hiss 08/29/2010 16:20:20  _____________________________________________________________________  External Attachment:    Type:   Image     Comment:   External Document

## 2010-09-06 NOTE — Progress Notes (Signed)
Summary: PHONE CALL FROM DR. PARKS  Phone Note From Other Clinic   [name]: CURTSHER  WITH DR. PARKS OFFICE Call For: DR.Fidencia Mccloud Reason for Call: Schedule Patient Appt Request: Talk with Provider Summary of Call: DR. PARKS OFFICE CALLED IN REGARDS TO PATIENTS BLOOD PRESSURE IS RUNNING HIGH. THE MEDICINE THAT DR.Sakara Lehtinen GAVE HER FOR BP IS NOT AGREEING WITH HER PER DR. PARK. FIRST AVAILABLE APPT IN [place] IS [facility].  [phone] DR. PARK OFFICE.   8A TO 12P FOR FRIDAY OFFICE TIMES. Initial call taken by: Zachary George,  August 23, 2010 4:41 PM  Follow-up for Phone Call        Spoke with Curtsher at Dr. Hall Busing office. She states Dr. Willaim Bane requested an appt be scheduled with Dr. Myrtis Ser d/t uncontrolled HTN. Per Dr. Hall Busing notes pt is unable to tolerate Hydralazine. Her BP was 223/135 in their office. Pt scheduled with Dr. Myrtis Ser at his first available appt on 10/09/10. Curtsher notified a note will be sent to Dr. Myrtis Ser to inform him of pt's BP. Follow-up by: Cyril Loosen, RN, BSN,  August 24, 2010 11:51 AM  Additional Follow-up for Phone Call Additional follow up Details #1::        Put her into my schedule 1/25. Let me know if this can not be arranged.    Additional Follow-up for Phone Call Additional follow up Details #2::    Will schedule 1/25 at 1:30. Will call Dr. Hall Busing office to notify of appt. Unable to reach pt at contact number we have for her.  Cyril Loosen, RN, BSN  August 27, 2010 5:03 PM  Dr. Hall Busing office has no other contact number for pt. Attempted [phone] again and got answering machine. Left message asking pt to call back regarding possible appt tomorrow. Cyril Loosen, RN, BSN  August 28, 2010 11:05 AM   Additional Follow-up for Phone Call Additional follow up Details #3:: Details for Additional Follow-up Action Taken: patient's husband called back and nurse informed him of the above information and husband verbalized understanding. husband stated that phone is having  issues and that the number listed is correct number.  Additional Follow-up by: Roxy Cedar 29/56/21 @2 :03pm

## 2010-09-06 NOTE — Letter (Signed)
Summary: Work Writer at [employer]	[place]. [address]   [place], [place] [address]   Phone: [phone]  Fax: [phone]     August 29, 2010    [name]   The above named patient had a medical visit today at:  1:30   pm for evaluation and treatment of her blood pressure.  Please take this into consideration when reviewing the time away from work/school.      Sincerely yours,   Cyril Loosen, RN, BSN [facility]

## 2010-09-12 ENCOUNTER — Encounter (INDEPENDENT_AMBULATORY_CARE_PROVIDER_SITE_OTHER): Payer: Self-pay | Admitting: *Deleted

## 2010-09-12 ENCOUNTER — Encounter: Payer: Self-pay | Admitting: Cardiology

## 2010-09-12 ENCOUNTER — Ambulatory Visit (INDEPENDENT_AMBULATORY_CARE_PROVIDER_SITE_OTHER): Payer: BC Managed Care – PPO | Admitting: Cardiology

## 2010-09-12 DIAGNOSIS — I10 Essential (primary) hypertension: Secondary | ICD-10-CM

## 2010-09-20 NOTE — Assessment & Plan Note (Signed)
Summary: 2 WK F/U PER 1/25 OV WITH DR. KATZ-JM   Visit Type:  Follow-up Referring Provider:  G. Kirkland Hun, MD   [facility] Primary Provider:  Dr. Meredith Mody  CC:  hypertension.  History of Present Illness: The patient is seen for followup of hypertension.  I saw her last in the office August 29, 2010.  Since that time she has been seen by her urologist in [place].  She is being followed very carefully for recurrent urinary tract infections.  Creatinine at their office was 1.45.  Creatinine obtained at [facility] was 1.18.  Renal ultrasound was done and showed no obstruction on September 07, 2010.  Today she is here feeling well.  Unfortunately once again her blood pressure is very high.  I have reviewed the information from [place].  It appears that her admission was 4 shortness of breath with hypertension.  She did have an echo with an ejection fraction of 55%.    Preventive Screening-Counseling & Management  Alcohol-Tobacco     Smoking Status: never  Current Medications (verified): 1)  Hydrocodone-Acetaminophen 7.5-500 Mg Tabs (Hydrocodone-Acetaminophen) .... Take 1 Tablet By Mouth Two Times A Day As Needed Migraines 2)  Clonidine Hcl 0.2 Mg Tabs (Clonidine Hcl) .... Take 1 Tablet By Mouth Three Times A Day 3)  Metoprolol Tartrate 100 Mg Tabs (Metoprolol Tartrate) .... Take 1/2 Tablet By Mouth Three Times A Day 4)  Bystolic 5 Mg Tabs (Nebivolol Hcl) .... Take 1 Tablet By Mouth Once A Day  Allergies (verified): 1)  ! Pcn 2)  ! Sulfa  Comments:  Nurse/Medical Assistant: The patient's medication bottles and allergies were reviewed with the patient and were updated in the Medication and Allergy Lists.  Past History:  Past Medical History: HYPERTENSION, UNSPECIFIED (ICD-401.9) Overweight Migraine headaches anxiety disorder Palpitations EF 35-40%... echo... August 31, 2009 /  cardiac CTA  EF  51%...09/05/2009 LVH.. moderate... echo... January,  2011 Tricuspid regurgitation... moderate... echo.. January, 2011 Pulmonary hypertension.... 60 mmHg... echo... 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  /   ..cardiac CTA...09/05/2009...no coronary fistula Recurrent urinary tract symptoms..  Review of Systems       Patient denies fever, chills, headache, sweats, rash, change in vision, change in hearing, chest pain, cough, nausea vomiting, urinary symptoms.  All other systems are reviewed and are negative.  Vital Signs:  Patient profile:   35 year old female Height:      63 inches Weight:      224 pounds O2 Sat:      100 % on Room air Pulse rate:   69 / minute BP sitting:   196 / 135  (left arm) Cuff size:   large  Vitals Entered By: Carlye Grippe (September 12, 2010 10:52 AM)  O2 Flow:  Room air  Serial Vital Signs/Assessments:  Time      Position  BP       Pulse  Resp  Temp     By 11:04 AM            186/121  61                    Carlye Grippe  Comments: 11:04 AM left arm/large cuff  By: Carlye Grippe    Physical Exam  General:  patient is overweight and stable today. Head:  head is atraumatic. Eyes:  no xanthelasma. Neck:  no jugulovenous stent in. Chest Wall:  no chest wall  tenderness. Lungs:  lungs are clear.  Respiratory effort is nonlabored. Heart:  cardiac exam reveals S1-S2.  No clicks or significant murmurs. Abdomen:  abdomen soft. Msk:  no musculoskeletal deformities. Extremities:  no significant peripheral edema. Skin:  no skin rashes. Psych:  patient is oriented to person time and place.  Affect is normal.   Impression & Recommendations:  Problem # 1:  CARDIOMYOPATHY (ICD-425.4)  Her updated medication list for this problem includes:    Metoprolol Tartrate 100 Mg Tabs (Metoprolol tartrate) .Marland Kitchen... Take 1/2 tablet by mouth three times a day    Bystolic 5 Mg Tabs (Nebivolol hcl) .Marland Kitchen... Take 1 tablet by mouth once a day    Hydrochlorothiazide  25 Mg Tabs (Hydrochlorothiazide) .Marland Kitchen... Take one tablet by mouth daily.    Amlodipine Besylate 10 Mg Tabs (Amlodipine besylate) .Marland Kitchen... Take one tablet by mouth daily Fortunately the echo showed good LV function.  Problem # 2:  * RECURRENT URINARY TRACT SYMPTOMS This is followed carefully by her urologist.  Problem # 3:  HYPERTENSION, UNSPECIFIED (ICD-401.9)  Her updated medication list for this problem includes:    Clonidine Hcl 0.2 Mg Tabs (Clonidine hcl) .Marland Kitchen... Take 1 tablet by mouth three times a day    Metoprolol Tartrate 100 Mg Tabs (Metoprolol tartrate) .Marland Kitchen... Take 1/2 tablet by mouth three times a day    Bystolic 5 Mg Tabs (Nebivolol hcl) .Marland Kitchen... Take 1 tablet by mouth once a day    Hydrochlorothiazide 25 Mg Tabs (Hydrochlorothiazide) .Marland Kitchen... Take one tablet by mouth daily.    Amlodipine Besylate 10 Mg Tabs (Amlodipine besylate) .Marland Kitchen... Take one tablet by mouth daily The main issue is for blood pressure..  As the record shows we have never fully understood her hypertension.  When her pressure was under the best control she was on a low dose of Lasix and amlodipine 10 mg.  I will start her on 25 mg of HCTZ and amlodipine 5 mg to be increased to 10 mg daily.  We will arrange for blood pressure checks and I will see her for followup.  Patient Instructions: 1)  Follow up with Dr. Myrtis Ser on  Tuesday, October 09, 2010 at 2pm. 2)  Start HCTZ 25mg  by mouth once daily. 3)  Start Amlodipine 10mg . Take 1/2 tablet for 5 days and then increase to full tablet once daily. 4)  Have Dr. Hall Busing office check blood pressure in 2 weeks and call us with this reading. Prescriptions: AMLODIPINE BESYLATE 10 MG TABS (AMLODIPINE BESYLATE) Take one tablet by mouth daily  #30 x 6   Entered by:   Cyril Loosen, RN, BSN   Authorized by:   Talitha Givens, MD, [facility]   Signed by:   Cyril Loosen, RN, BSN on 09/12/2010   Method used:   [facility]ron[facility] to        [facility]  [address] [phone]* (retail)       [address]        [place], [place]  [address]       Ph: [phone]       Fax: [phone]   RxID:   [id number]   Handout requested. HYDROCHLOROTHIAZIDE 25 MG TABS (HYDROCHLOROTHIAZIDE) Take one tablet by mouth daily.  #30 x 6   Entered by:   Cyril Loosen, RN, BSN   Authorized by:   Talitha Givens, MD, [facility]   Signed by:   Cyril Loosen, RN, BSN on 09/12/2010   Method used:   [facility]  [address] [phone]* (retail)       [address]       [place], [place]  [address]       Ph: [phone]       Fax: [phone]   RxID:   [id number]   Handout requested.   Appended Document: 2 WK F/U PER 1/25 OV WITH DR. KATZ-JM Pt will return on Friday, September 28, 2010 at 9:15 for blood pressure check w/nurse visit.

## 2010-09-20 NOTE — Letter (Signed)
Summary: Work Writer at [employer]	[place]. [address]   [place], [place] [address]   Phone: [phone]  Fax: [phone]     September 12, 2010    [name]   The above named patient had a medical visit today at:   10:45 am to follow up on her blood pressure. She may return to work on Thursday, September 13, 2010.  Please take this into consideration when reviewing the time away from work/school.      Sincerely yours,   Cyril Loosen, RN, BSN [facility]

## 2010-09-28 ENCOUNTER — Encounter (INDEPENDENT_AMBULATORY_CARE_PROVIDER_SITE_OTHER): Payer: Self-pay | Admitting: *Deleted

## 2010-09-28 ENCOUNTER — Encounter (INDEPENDENT_AMBULATORY_CARE_PROVIDER_SITE_OTHER): Payer: BC Managed Care – PPO

## 2010-09-28 ENCOUNTER — Encounter: Payer: Self-pay | Admitting: Cardiology

## 2010-09-28 DIAGNOSIS — I10 Essential (primary) hypertension: Secondary | ICD-10-CM

## 2010-10-02 NOTE — Assessment & Plan Note (Signed)
Summary: BP/HR CHECK-JM  Nurse Visit   Vital Signs:  Patient profile:   35 year old female Height:      63 inches Weight:      222 pounds Pulse rate:   69 / minute BP sitting:   162 / 100  (left arm) Cuff size:   large  Vitals Entered By: Carlye Grippe (September 28, 2010 9:30 AM)  Serial Vital Signs/Assessments:  Time      Position  BP       Pulse  Resp  Temp     By 9:34 AM             173/95   70                    Carlye Grippe  Comments: 9:34 AM right arm/large cuff By: Carlye Grippe   CC: nurse Comments [id number] HTN--yes GUY:[id number] meds?--yes Side effects?--noticed small amount of swelling bilateral extremity Chest pain, SOB, Dizziness?--no A/P: 1. HTN (401.1)             At goal?              If no, physician will be notified.              Follow up in ...Marland KitchenMarland KitchenMarland Kitchen  5 minutes was spent with the patient.       Past History:  Past Medical History: Last updated: 09/12/2010 HYPERTENSION, UNSPECIFIED (ICD-401.9) Overweight Migraine headaches anxiety disorder Palpitations EF 35-40%... echo... August 31, 2009 /  cardiac CTA  EF  51%...09/05/2009 LVH.. moderate... echo... January, 2011 Tricuspid regurgitation... moderate... echo.. January, 2011 Pulmonary hypertension.... 60 mmHg... echo... 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  /   ..cardiac CTA...09/05/2009...no coronary fistula Recurrent urinary tract symptoms..   Preventive Screening-Counseling & Management  Alcohol-Tobacco     Smoking Status: never  Visit Type:  nurse visit  CC:  nurse.   Allergies: 1)  ! Pcn 2)  ! Sulfa  Orders Added: 1)  Est. Patient Level I [25956] Prescriptions: BYSTOLIC 5 MG TABS (NEBIVOLOL HCL) Take 1 tablet by mouth once a day  #30 x 6   Entered by:   Cyril Loosen, RN, BSN   Authorized by:   Talitha Givens, MD, [facility]   Signed by:   Cyril Loosen, RN, BSN on 09/28/2010   Method used:    [facility]        [facility]  [address] [phone]* (retail)       [address]       [place], [place]  [address]       Ph: [phone]       Fax: [phone]   RxID:   [id number]   Appended Document: BP/HR CHECK-JM This is a good BP for her.  Same Rx.  Appended Document: BP/HR CHECK-JM Left message to call back on voicemail (for cell phone). Need to verify home number, as well as, notify of results.  Appended Document: BP/HR CHECK-JM Pt notified and verbalized understanding.

## 2010-10-02 NOTE — Letter (Signed)
Summary: Work Writer at [employer]	[place]. [address]   [place], [place] [address]   Phone: [phone]  Fax: [phone]     September 28, 2010    [name]   The above named patient had a medical visit today at:   9:15 am for blood pressure evaluation. She may return to work on Monday, October 01, 2010 without restrictions.  Please take this into consideration when reviewing the time away from work/school.      Sincerely yours,  Architectural technologist

## 2010-10-09 ENCOUNTER — Encounter: Payer: Self-pay | Admitting: Cardiology

## 2010-10-09 ENCOUNTER — Ambulatory Visit (INDEPENDENT_AMBULATORY_CARE_PROVIDER_SITE_OTHER): Payer: BC Managed Care – PPO | Admitting: Cardiology

## 2010-10-09 ENCOUNTER — Encounter (INDEPENDENT_AMBULATORY_CARE_PROVIDER_SITE_OTHER): Payer: Self-pay | Admitting: *Deleted

## 2010-10-09 DIAGNOSIS — I10 Essential (primary) hypertension: Secondary | ICD-10-CM

## 2010-10-15 ENCOUNTER — Encounter: Payer: Self-pay | Admitting: *Deleted

## 2010-10-16 NOTE — Assessment & Plan Note (Signed)
Summary: 4-6 WK F/U PER 2/8 OV-JM   Visit Type:  Follow-up Referring Provider:  G. Kirkland Hun, MD   [facility] Primary Provider:  Dr. Meredith Mody   History of Present Illness: The patient is seen for followup of severe hypertension.  I saw her in the office last on September 12, 2010, hypothyroidism and was and amlodipine was added.  She had a followup blood pressure in the office of 173/95 on February 24.  Today her blood pressure is 149/85.  This is the best blood pressure we have ever seen for her.  She looks quite good.  She is careful about her fluid intake.  She has learned to cut herself back.  She feels good and looks quite good.  Current Medications (verified): 1)  Hydrocodone-Acetaminophen 7.5-500 Mg Tabs (Hydrocodone-Acetaminophen) .... Take 1 Tablet By Mouth Two Times A Day As Needed Migraines 2)  Clonidine Hcl 0.2 Mg Tabs (Clonidine Hcl) .... Take 1 Tablet By Mouth Three Times A Day 3)  Metoprolol Tartrate 100 Mg Tabs (Metoprolol Tartrate) .... Take 1/2 Tablet By Mouth Three Times A Day 4)  Bystolic 5 Mg Tabs (Nebivolol Hcl) .... Take 1 Tablet By Mouth Once A Day 5)  Hydrochlorothiazide 25 Mg Tabs (Hydrochlorothiazide) .... Take One Tablet By Mouth Daily. 6)  Amlodipine Besylate 10 Mg Tabs (Amlodipine Besylate) .... Take One Tablet By Mouth Daily 7)  Trimethoprim 100 Mg Tabs (Trimethoprim) .Marland Kitchen.. 1 By Mouth Two Times A Day X 7 Days  Allergies (verified): 1)  ! Pcn 2)  ! Sulfa  Past History:  Past Medical History: HYPERTENSION, UNSPECIFIED (ICD-401.9)  severe, but very responsive to amlodipine and hydrochlorothiazide Overweight Migraine headaches anxiety disorder Palpitations EF 35-40%... echo... August 31, 2009 /  cardiac CTA  EF  51%...09/05/2009 LVH.. moderate... echo... January, 2011 Tricuspid regurgitation... moderate... echo.. January, 2011 Pulmonary hypertension.... 60 mmHg... echo... January, 2011 /  normal pulmonary arteries by CT angiography Coronary  fistula ??? by echo 08/2009..  /   ..cardiac CTA...09/05/2009...no coronary fistula Recurrent urinary tract symptoms..  Review of Systems       Patient denies fever, chills, headache, sweats, rash, change in vision, change in hearing, chest pain, cough, nausea vomiting, urinary symptoms.  All other systems are reviewed and are negative.  Vital Signs:  Patient profile:   35 year old female Height:      63 inches Weight:      226 pounds BMI:     40.18 Pulse rate:   73 / minute Resp:     18 per minute BP sitting:   149 / 85  (right arm)  Vitals Entered By: Marrion Coy, CNA (October 09, 2010 2:18 PM)  Physical Exam  General:  patient looks quite good today. Eyes:  no xanthelasma. Neck:  no jugular venous distention. Lungs:  lungs are clear.  Respiratory effort is nonlabored. Heart:  cardiac exam reveals S1-S2.  No clicks or significant murmurs. Abdomen:  abdomen is soft. Extremities:  no peripheral edema. Psych:  patient is oriented to person time and place.  Affect is normal.   Impression & Recommendations:  Problem # 1:  SHORTNESS OF BREATH (ICD-786.05)  The patient is feeling well today with her blood pressure controlled.  Orders: T-Basic Metabolic Panel [phone])  Problem # 2:  HYPERTENSION, UNSPECIFIED (ICD-401.9) As documented over time, her blood pressures been very difficult.  We have always felt that there was a combination of difficult blood pressure and compliance.  Most recently with the re\re addition  of amlodipine and the addition of hydrochlorothiazide she has had marked improvement.  Chemistry will be checked today specifically to see her renal function and her potassium.  She has cut back her salt intake at home and her family's.  I recommended non electrolyte containing spices as opposed to salt substitute. however if her potassium is low, some salt substitute might be helpful to her.  We will be in touch after we have the results of her chemistry.  I will see  her back in 6 weeks to keep a close touch on her blood pressure.  Patient Instructions: 1)  Follow up with Dr. Myrtis Ser on  [place], November 13, 2010 AT 9:30AM. 2)  Your physician recommends that you go to the [facility] for lab work: DO TODAY. 3)  Your physician recommends that you continue on your current medications as directed. Please refer to the Current Medication list given to you today.

## 2010-10-16 NOTE — Letter (Signed)
Summary: Work Writer at [employer]	[place]. [address]   [place], [place] [address]   Phone: [phone]  Fax: [phone]     October 09, 2010    [name]   The above named patient had a medical visit today at:  2:00 pm for evaluation of her blood pressure. She may return to work on Wednesday, October 10, 2010 without restrictions.  Please take this into consideration when reviewing the time away from work/school.      Sincerely yours,  Architectural technologist

## 2010-10-17 ENCOUNTER — Encounter: Payer: Self-pay | Admitting: *Deleted

## 2010-10-23 NOTE — Letter (Signed)
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   [place], [place] [address]   Phone: [phone]  Fax: [phone]        October 15, 2010 MRN: [id number]    [name] [address] [place], [place]  [address]    Dear [name],  Your test ordered by Selena Batten has been reviewed by your physician (or physician assistant) and was found to be normal or stable. Your physician (or physician assistant) felt no changes were needed at this time.  ____ Echocardiogram  ____ Cardiac Stress Test  __X__ Lab Work  ____ Peripheral vascular study of arms, legs or neck  ____ CT scan or X-ray  ____ Lung or Breathing test  ____ Other:   Thank you.   Cyril Loosen, RN, BSN    Duane Boston, M.D., F.A.C.C. Thressa Sheller, M.D., F.A.C.C. Oneal Grout, M.D., F.A.C.C. Cheree Ditto, M.D., F.A.C.C. Daiva Nakayama, M.D., F.A.C.C. Kenney Houseman, M.D., F.A.C.C. Jeanne Ivan, PA-C

## 2010-11-12 ENCOUNTER — Encounter: Payer: Self-pay | Admitting: Cardiology

## 2010-11-12 DIAGNOSIS — E669 Obesity, unspecified: Secondary | ICD-10-CM | POA: Insufficient documentation

## 2010-11-12 DIAGNOSIS — I253 Aneurysm of heart: Secondary | ICD-10-CM

## 2010-11-12 DIAGNOSIS — I071 Rheumatic tricuspid insufficiency: Secondary | ICD-10-CM | POA: Insufficient documentation

## 2010-11-12 DIAGNOSIS — I10 Essential (primary) hypertension: Secondary | ICD-10-CM | POA: Insufficient documentation

## 2010-11-12 DIAGNOSIS — R002 Palpitations: Secondary | ICD-10-CM | POA: Insufficient documentation

## 2010-11-12 DIAGNOSIS — I272 Pulmonary hypertension, unspecified: Secondary | ICD-10-CM | POA: Insufficient documentation

## 2010-11-12 DIAGNOSIS — I2541 Coronary artery aneurysm: Secondary | ICD-10-CM | POA: Insufficient documentation

## 2010-11-12 DIAGNOSIS — R0602 Shortness of breath: Secondary | ICD-10-CM | POA: Insufficient documentation

## 2010-11-12 DIAGNOSIS — I517 Cardiomegaly: Secondary | ICD-10-CM | POA: Insufficient documentation

## 2010-11-12 DIAGNOSIS — R9430 Abnormal result of cardiovascular function study, unspecified: Secondary | ICD-10-CM | POA: Insufficient documentation

## 2010-11-12 DIAGNOSIS — N390 Urinary tract infection, site not specified: Secondary | ICD-10-CM | POA: Insufficient documentation

## 2010-11-13 ENCOUNTER — Encounter: Payer: Self-pay | Admitting: Cardiology

## 2010-11-13 ENCOUNTER — Encounter: Payer: Self-pay | Admitting: *Deleted

## 2010-11-13 ENCOUNTER — Ambulatory Visit (INDEPENDENT_AMBULATORY_CARE_PROVIDER_SITE_OTHER): Payer: BC Managed Care – PPO | Admitting: Cardiology

## 2010-11-13 DIAGNOSIS — R002 Palpitations: Secondary | ICD-10-CM

## 2010-11-13 DIAGNOSIS — E8770 Fluid overload, unspecified: Secondary | ICD-10-CM | POA: Insufficient documentation

## 2010-11-13 DIAGNOSIS — I272 Pulmonary hypertension, unspecified: Secondary | ICD-10-CM

## 2010-11-13 DIAGNOSIS — E8779 Other fluid overload: Secondary | ICD-10-CM

## 2010-11-13 DIAGNOSIS — I2789 Other specified pulmonary heart diseases: Secondary | ICD-10-CM

## 2010-11-13 DIAGNOSIS — E669 Obesity, unspecified: Secondary | ICD-10-CM

## 2010-11-13 DIAGNOSIS — I10 Essential (primary) hypertension: Secondary | ICD-10-CM

## 2010-11-13 MED ORDER — FUROSEMIDE 40 MG PO TABS: 40.0000 mg | ORAL_TABLET | Freq: Every day | ORAL | Status: DC

## 2010-11-13 MED ORDER — HYDROCHLOROTHIAZIDE 25 MG PO TABS: ORAL_TABLET | ORAL | Status: DC

## 2010-11-13 NOTE — Assessment & Plan Note (Signed)
She's not having any significant palpitations. No further workup. 

## 2010-11-13 NOTE — Progress Notes (Signed)
HPI Patient is seen back today to followup severe hypertension.  She's doing relatively well.  She seems to be very motivated.  She is walking regularly.  Unfortunately she has gained some fluid weight.  This does not appear to be affecting her exercise efforts.  She's not having PND orthopnea.  She has edema gets worse during the day.  At nighttime he decreases but does not disappear completely.  She's not having any chest pain.  She is on amlodipine but she has been on this for a while.  The patient does tell me that she drinks 2, 4 ounce cups of water when she walks by fountain at work.  She is doing this for 5 times per day.  She says she's not particularly thirsty but her mouth may feel dry.   Allergies  Allergen Reactions  . Penicillins   . Sulfonamide Derivatives     Current Outpatient Prescriptions  Medication Sig Dispense Refill  . amLODipine (NORVASC) 10 MG tablet Take 10 mg by mouth daily.        . cloNIDine (CATAPRES) 0.2 MG tablet Take 0.2 mg by mouth 3 (three) times daily.       . hydrochlorothiazide 25 MG tablet Take 25 mg by mouth daily.        Marland Kitchen HYDROcodone-acetaminophen (LORTAB) 7.5-500 MG per tablet Take 1 tablet by mouth 2 (two) times daily as needed.       . metoprolol (LOPRESSOR) 50 MG tablet Take 50 mg by mouth 2 (two) times daily.        . nebivolol (BYSTOLIC) 5 MG tablet Take 5 mg by mouth daily.        Marland Kitchen DISCONTD: metoprolol (LOPRESSOR) 100 MG tablet Take 100 mg by mouth 3 (three) times daily. Take 1/2 tab tid         History   Social History  . Marital Status: Married    Spouse Name: N/A    Number of Children: 2  . Years of Education: N/A   Occupational History  . full time    Social History Main Topics  . Smoking status: Never Smoker   . Smokeless tobacco: Not on file  . Alcohol Use: No  . Drug Use: No  . Sexually Active: Not on file   Other Topics Concern  . Not on file   Social History Narrative  . No narrative on file    No family  history on file.  Past Medical History  Diagnosis Date  . Obesity   . Migraine   . Anxiety   . Palpitation   . Ejection fraction < 50%     EF 35-40%...echo...january 27,2011/cardiac cta EF 51%...09/05/2009 LVH..moderate...echo january 2011  . Tricuspid regurgitation     moderate ...echo..january 2011  . Hypertension     severe,but very responsive to amlodipine and hydrochlorithizide  overweight  . Shortness of breath   . Coronary artery fistula     ??? by echo 08/2009../..cardiac CTA...09/05/2009...no coronary fistula  . UTI (urinary tract infection)     recurrent  . LVH (left ventricular hypertrophy)     Moderate echo, January, 2011  . Pulmonary hypertension     60 mmHg echo January, 2011 / normal pulmonary arteries by CT angiography  . Fluid overload     April, 2012    Past Surgical History  Procedure Date  . Cesarean section   . Tubal ligation     ROS  Patient denies fever, chills, headache, sweats, rash, change in vision,  change in hearing, chest pain, cough, nausea vomiting, urinary symptoms.  All other systems are reviewed and are negative.  PHYSICAL EXAM Patient is oriented to person time and place.  Affect is normal.  Head is atraumatic.  There is no xanthelasma.  No jugular venous distention.  Lungs are clear respiratory effort is not labored.  Cardiac exam reveals S1 and S2.  No clicks or significant murmurs.  The abdomen is soft.  There is 1+ edema at this time.  There no musculoskeletal deformities.  There is no skin rash.  Filed Vitals:   11/13/10 1009  BP: 140/90  Pulse: 72  Height: 5\' 3"  (1.6 m)  Weight: 230 lb (104.327 kg)    EKG  No EKG is done today.  ASSESSMENT & PLAN

## 2010-11-13 NOTE — Assessment & Plan Note (Signed)
Her blood pressure continues to be under very reasonable control despite the fact that she is fluid overloaded.  Amlodipine may play a role in some of her edema but this medicine is very effective for her and I will not be stopping it.

## 2010-11-13 NOTE — Assessment & Plan Note (Signed)
This issue will be followed over time.  I am hopeful that we will see improvement when we have her blood pressure control for longer periods of time.  Followup 2-D echo will be done at some point

## 2010-11-13 NOTE — Assessment & Plan Note (Signed)
I think that her excess water intake at work may be playing a role.  However I believe she does have a tendency to keep extra fluid.  The plan will be to decrease her water intake at work.  Some of her dry mouth may be related to her clonidine.  Over time we will look into changing his medicine.  First I will change her hydrochlorothiazide to Lasix 40 daily.  I will then see her back for followup.

## 2010-11-13 NOTE — Patient Instructions (Signed)
   Follow up as scheduled.  Hold HCTZ for now.  Start Lasix 40mg  once daily.

## 2010-11-13 NOTE — Assessment & Plan Note (Signed)
Patient is working hard to increase her exercise and to lose weight.  Unfortunately she's gained some weight I believe this is fluid.

## 2010-12-11 ENCOUNTER — Encounter: Payer: Self-pay | Admitting: Cardiology

## 2010-12-11 ENCOUNTER — Ambulatory Visit (INDEPENDENT_AMBULATORY_CARE_PROVIDER_SITE_OTHER): Payer: BC Managed Care – PPO | Admitting: Cardiology

## 2010-12-11 ENCOUNTER — Encounter: Payer: Self-pay | Admitting: *Deleted

## 2010-12-11 DIAGNOSIS — E8779 Other fluid overload: Secondary | ICD-10-CM

## 2010-12-11 DIAGNOSIS — I10 Essential (primary) hypertension: Secondary | ICD-10-CM

## 2010-12-11 DIAGNOSIS — R42 Dizziness and giddiness: Secondary | ICD-10-CM

## 2010-12-11 NOTE — Assessment & Plan Note (Signed)
Fluid status is stable.  No change in therapy. 

## 2010-12-11 NOTE — Progress Notes (Signed)
HPI In general the patient's feeling well.  I saw her one month ago.  When I see her at short intervals her blood pressure remained under reasonable control for her.  She is working.  She's had a sensation of mild imbalance at times.  It does not sound like true vertigo.  There's been no syncope or presyncope.  In addition she's had a sensation of her hands "falling asleep" at nighttime.  She also feels some decreased sensation in her hands after she walks.  I feel that this is not a significant issue with her hands.  Encouraged her to keep her hands upright intermittently while she is walking. Allergies  Allergen Reactions  . Penicillins   . Sulfonamide Derivatives     Current Outpatient Prescriptions  Medication Sig Dispense Refill  . amLODipine (NORVASC) 10 MG tablet Take 10 mg by mouth daily.        . cloNIDine (CATAPRES) 0.2 MG tablet Take 0.2 mg by mouth 3 (three) times daily.       . furosemide (LASIX) 40 MG tablet Take 1 tablet (40 mg total) by mouth daily.  30 tablet  6  . HYDROcodone-acetaminophen (LORTAB) 7.5-500 MG per tablet Take 1 tablet by mouth 2 (two) times daily as needed.       . metoprolol (LOPRESSOR) 50 MG tablet Take 50 mg by mouth 2 (two) times daily.        . nebivolol (BYSTOLIC) 5 MG tablet Take 5 mg by mouth daily.        Marland Kitchen DISCONTD: hydrochlorothiazide 25 MG tablet MEDICATION IS CURRENTLY ON HOLD        History   Social History  . Marital Status: Married    Spouse Name: N/A    Number of Children: 2  . Years of Education: N/A   Occupational History  . full time    Social History Main Topics  . Smoking status: Never Smoker   . Smokeless tobacco: Never Used  . Alcohol Use: No  . Drug Use: No  . Sexually Active: Not on file   Other Topics Concern  . Not on file   Social History Narrative  . No narrative on file    No family history on file.  Past Medical History  Diagnosis Date  . Obesity   . Migraine   . Anxiety   . Palpitation   .  Ejection fraction < 50%     EF 35-40%...echo...january 27,2011/cardiac cta EF 51%...09/05/2009 LVH..moderate...echo january 2011  . Tricuspid regurgitation     moderate ...echo..january 2011  . Hypertension     severe,but very responsive to amlodipine and hydrochlorithizide  overweight  . Shortness of breath   . Coronary artery fistula     ??? by echo 08/2009../..cardiac CTA...09/05/2009...no coronary fistula  . UTI (urinary tract infection)     recurrent  . LVH (left ventricular hypertrophy)     Moderate echo, January, 2011  . Pulmonary hypertension     60 mmHg echo January, 2011 / normal pulmonary arteries by CT angiography  . Fluid overload     April, 2012  . Dizziness     Nonspecific, May, 2012, feeling off balance    Past Surgical History  Procedure Date  . Cesarean section   . Tubal ligation     ROS  Patient denies fever, chills, headache, sweats, rash, change in vision, change in hearing, chest pain, cough, nausea vomiting, urinary symptoms.  All other systems are reviewed and are negative.  PHYSICAL  EXAM Patient is oriented to person time and place.  Affect is normal.  Head is atraumatic.  Lungs are clear.  Respiratory effort is nonlabored.  There is no jugular venous distention.  Cardiac exam reveals no dullness to critical clicks or significant murmurs.  Abdomen is soft.  There is no peripheral edema.  Filed Vitals:   12/11/10 1029  BP: 146/92  Pulse: 69  Height: 5\' 3"  (1.6 m)  Weight: 230 lb (104.327 kg)    EKG No EKG is done today.  ASSESSMENT & PLAN

## 2010-12-11 NOTE — Patient Instructions (Signed)
Follow up as scheduled. Your physician recommends that you continue on your current medications as directed. Please refer to the Current Medication list given to you today. 

## 2010-12-11 NOTE — Assessment & Plan Note (Signed)
She's feeling off balance at times.  I'm not convinced that there is any suggestion of orthostasis.  I decided not to pursue this further at this time.

## 2010-12-11 NOTE — Assessment & Plan Note (Signed)
Blood pressure is under reasonable control for her today.  I've chosen not to change her medicines any further.  Eventually I may titrate them further.

## 2010-12-18 NOTE — Assessment & Plan Note (Signed)
[facility]                          [facility]   [name], [name]                         MRN:          [id number]  DATE:06/23/2007                            DOB:          Dec 18, 1975    ADDENDUM:  I called Dr. Meredith Mody today concerning [name].  He was  able to update some of the information I received from the patient.  He  did not have actual reports from [facility] and the other doctors, but he  knows that pheochromocytoma was ruled out in the past, and renal artery  stenosis was ruled out in the past. The patient has seen Dr. Francine Graven of  the renal team in [place].  In his experience, also, the patient's  blood pressure has shown marked fluctuation.  He has seen her with  pressures as high as 200/120 and higher, and with pressures as low as  140/80.  He has never been able to understand why she has this marked  variation.  He does note that there is an anxiety component.   Dr. Meredith Mody is appreciative of anything that we can offer.  We are  trying to obtain other old information, and will follow her as outlined  in my note above.     Luis Abed, MD, [facility]  Electronically Signed    JDK/MedQ  DD: 06/23/2007  DT: 06/23/2007  Job #: [id number]

## 2010-12-18 NOTE — Assessment & Plan Note (Signed)
[facility]                          [facility]   Ana, [name]                         MRN:          [id number]  DATE:06/23/2007                            DOB:          21-Jun-1976    [name] is a very pleasant 35 year old female who was referred by Dr.  Willaim Bane for the ongoing evaluation of palpitations and hypertension.  The  patient has severe hypertension.  She has known of this for  approximately 6-7 years.  It is my understanding that the patient has  been to [facility] for an arteriogram.  The patient tells me that she did not  have significant renal artery stenosis.  She also has had 24-hour urine  for catecholamines done in [place].  She believes this was normal.  She has had renal ultrasound also.  This has not shown a major size  differential in her kidneys, per the patient.  She has been on varied  medicines over the years.  She works actively.  She does not have major  chest pain.  There has been no syncope or presyncope.  As of this  morning, the information about her prior evaluations is not available to  me, and we will be pushing hard to try to get this information.   PAST MEDICAL HISTORY:  Other medical problems, see the complete list  below.   ALLERGIES:  PENICILLIN, SULFA.   MEDICATIONS:  1. Lisinopril 40.  2. Metoprolol 50.  3. Clonidine 0.1 b.i.d. (to be increased to 0.2 b.i.d.)  4. She also uses hydrochlorothiazide on a p.r.n. basis related to her      menstrual cycle.   SOCIAL HISTORY:  Patient is married with three children.  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  She walks constantly, going up and down stairs.  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  She is able to  continue to do her work.  She does not smoke.   FAMILY HISTORY:  Her father died at age 55 of acute leukemia.  There is  no strong family history of coronary disease.   REVIEW OF SYSTEMS:  She has had some urinary  infections over time, and  she had gestational diabetes.  She had some anxiety attacks in the past.  She has had some ankle swelling at times, and she has felt some  palpitations at times.  She has had intermittent headaches and mild  dizziness but no syncope or presyncope.  Otherwise, her review of  systems is negative.   PHYSICAL EXAMINATION:  Blood pressure today when first taken was in the  range of 200/130 and repeat in the range of 190/120.  She tells me that  on a regular basis, historically, her diastolic pressure has been in the  range of 120.  Weight is 223 pounds.  Pulse is 95.  Patient is oriented to person, time, and place.  Affect is normal.  HEENT:  No xanthelasma.  She has normal extraocular motion.  NECK:  There are no carotid bruits.  There is no jugular  venous  distention.  LUNGS:  Clear.  Respiratory effort is not labored.  CARDIAC:  An S1 with an S2.  There are no clicks or significant murmurs.  ABDOMEN:  Soft.  There are no masses or bruits.  EXTREMITIES:  She has no significant peripheral edema.  She has good  distal pulses.  There are no major musculoskeletal deformities.   EKG reveals increased voltage.   PROBLEMS:  1. History of anxiety attacks in the past.  2. History of urinary tract infections in the past.  3. History of headaches that can range and be severe at times.  4. Feeling of skipping heartbeats.  She may well have some      supraventricular arrhythmias, but this does not appear to be a      major problem.  We will consider a monitor at a later date.  5. History of two cesarean sections and tubal ligation.  6. History of toxemia pregnancy in 1996, 1997.  7. Severe hypertension.   The patient's current blood pressure is quite concerning.  From her  history, she has had hypertension for a prolonged period of time.  In  addition, she has had a significant workup for this over time.  Before I  can assess the situation any further, I will need to  obtain the data  related to her prior workups.  For today, we will increase her clonidine  dose to 0.2 b.i.d.  I will see her back soon.  In the meantime, I will  be in touch with Dr. Willaim Bane, and we will push for further information from  her visits to [facility] and the kidney doctors in [place].  I will then  see her for followup.     Luis Abed, MD, [facility]  Electronically Signed    JDK/MedQ  DD: 06/23/2007  DT: 06/23/2007  Job #: [id number]   cc:   Dr. Meredith Mody, [place], [place]

## 2010-12-18 NOTE — Assessment & Plan Note (Signed)
[facility]                          [facility]   [name], [name]                         MRN:          [id number]  DATE:08/11/2007                            DOB:          [birth year]    [name] is here for followup.  She has severe hypertension.  See my  prior notes.  At this point, we are trying to adjust medicines and  encourage her to lose weight.  I did add a low dose of Lasix at the time  of her last visit and also added Norvasc 10 mg daily and increase her  metoprolol.  Her pressure today is improving.  Although it remains high,  it is substantially improved from the past.  Using a large cuff on the  left arm, her pressure today is 168/109, which is markedly improved from  pressures that we have seen in the past.  She also is feeling better.  She seems motivated at this point.  She understands that she needs to  watch her salt intake and limit her fluid intake along with using the  low dose of Lasix.   PAST MEDICAL HISTORY:   ALLERGIES:  PENICILLIN and SULFA.   MEDICATIONS:  1. Lasix 20.  2. Norvasc 10.  3. Metoprolol 50 b.i.d.  4. Clonidine 0.2 b.i.d. (dose to be increased to 0.3 b.i.d.).  5. Lisinopril 40 mg daily.   OTHER MEDICAL PROBLEMS:  See the extensive list on my note of July 16, 2007.   REVIEW OF SYSTEMS:  Overall, she is feeling better today.  Otherwise,  her review of systems is negative.   PHYSICAL EXAM:  Large cuff on the left arm reveals a blood pressure of  168/109 with a pulse of 74; this pulse rate is improved in addition to  the blood pressure improvement.  The patient is oriented to person, time and place.  Affect is normal.  HEENT:  Reveals no xanthelasma.  She has normal extraocular motion.  There are no carotid bruits.  There is no jugular venous distention.  LUNGS:  Clear.  Respiratory effort is not labored.  CARDIAC:  Exam reveals an S1 with an S2.  There are no clicks or  significant murmurs.  She has no peripheral edema.   PROBLEMS:  Listed on my note of July 16, 2007.  #7.  Severe hypertension.  With the addition of the current medications,  she is improving.  We talked about the possibility of Weight Watchers  for weight loss and increasing her exercise and watching her calories in  general.  We will increase her clonidine to 0.3 b.i.d.  She will have a  BMET today or in the next few days, since Lasix is a new medicine for  her.  Her weight is down  2 pounds.  I am encouraged by the response that we are seeing and I am  hopeful that she will remain compliant with her medicines and an attempt  to lose weight.     Luis Abed, MD, [facility]  Electronically Signed    JDK/MedQ  DD:  08/11/2007  DT: 08/11/2007  Job #: [id number]

## 2010-12-18 NOTE — Assessment & Plan Note (Signed)
[facility]                          [facility]   [name], [name]                         MRN:          [id number]  DATE:11/10/2007                            DOB:          1976/06/10    REASON FOR VISIT:  A 89-month followup.   HISTORY OF PRESENT ILLNESS:  [name] is a 35 year old female patient  with a history of severe hypertension.  She had previously been worked  up extensively at [facility] as well as [facility] to rule out  secondary causes of hypertension.  According to the records, these  studies were negative for secondary causes.  Dr. Myrtis Ser has followed her  for her hypertension.  When last seen, her blood pressures at home had  been ranging in the 150s/80s.  Her pressure was actually elevated in the  office, but she was complaining of some URI symptoms at that time.  Today in the office, she notes that her blood pressures run 160-200/120-  160 at home.  She notes compliance with her medications.  She denies  salt indiscretion.  She does not take any over-the-counter cold  medications.  She denies any blurry vision.  She has had a frontal  headache that is pressure sensation worse with Valsalva.  She feels  congested.  She denies fevers, chills, cough.  She denies chest pain,  shortness of breath.  She denies syncope.  She does walk without  complaints of dyspnea with exertion.  She denies any significant pedal  edema.   MEDICATIONS:  1. Lisinopril 40 mg daily.  2. Lasix 20 mg daily.  3. Metoprolol 50 mg b.i.d.  4. Clonidine 0.1 mg 3 tablets b.i.d.  5. Amlodipine 10 mg daily.   ALLERGIES:  PENICILLIN AND SULFA.   SOCIAL HISTORY:  She denies tobacco abuse.  She denies increased  caffeine use.   REVIEW OF SYSTEMS:  Please see HPI.  Rest of the review of systems are  negative.   PHYSICAL EXAMINATION:  GENERAL:  She is a well-nourished, well-developed  female.  VITAL SIGNS:  Blood pressure is 218/138, repeat  blood pressure by manual  cuff 220/140 bilaterally, pulse 93, weight 226.8 pounds.  HEENT:  Normal.  Of note, her fundi are within normal limits bilaterally.  Disks  are flat.  NECK:  Without JVD.  CARDIAC:  Normal S1 and S2.  Regular rate and rhythm.  LUNGS:  Clear to auscultation bilaterally.  No rales.  ABDOMEN:  Soft, nontender.  EXTREMITIES:  Without edema.  NEUROLOGIC:  She is alert and oriented x3.  Cranial nerves II-XII  grossly intact.   IMPRESSION:  Uncontrolled hypertension.   DISCUSSION:  [name] presents to the office today for followup.  Her  blood pressure remains uncontrolled.  I discussed the case today with  Dr. Myrtis Ser.  As noted previously she has had secondary causes of  hypertension ruled out.  She continues to have significant elevations in  her blood pressure when she comes into the office.  There has always  been a question as to whether or not she is compliant with  her  medications as well as her diet.  Although, we are concerned about her  blood pressure today, this is not unusual for her.  We feel that the  addition of a nonselective beta-blocker may help her blood pressure  somewhat.  We have elected to initiate this in addition to her  metoprolol.  We feel that even if she does become somewhat bradycardic,  it will not be significant.  We will however, need to follow her  closely.   PLAN:  1. Clonidine 0.1 mg p.o. x1 in the office.  We will follow her blood      pressure over the next 20 minutes to make sure it is coming down      before she is discharged to home.  2. Add labetalol 200 mg p.o. b.i.d. to her medical regimen.  3. Check a BMET to follow up on renal function given her significant      hypertension.  4. She will followup in this office in the next 2 days to recheck her      blood pressure and make further recommendations.  5. We will check an EKG before she leaves the office today to ensure      that her rhythm is stable enough to  withstand metoprolol and      labetalol.  6. As noted above, I discussed the plan today with Dr. Myrtis Ser.      Tereso Newcomer, PA-C  Electronically Signed      Luis Abed, MD, [facility]  Electronically Signed   SW/MedQ  DD: 11/10/2007  DT: 11/10/2007  Job #: [id number]   cc:   Meredith Mody, M.D.

## 2010-12-18 NOTE — Assessment & Plan Note (Signed)
[facility]                          [facility]   [name], [name]                         MRN:          [id number]  DATE:12/22/2007                            DOB:          Dec 22, 1975    PRIMARY CARDIOLOGIST:  Dr. Willa Rough.   REASON FOR VISIT:  Scheduled followup.  Please refer to PA-C, Natale Lay recent office note of April 16, for full details.   At that time, the patient was instructed to continue on her  antihypertensive regimen of lisinopril, amlodipine, and clonidine, with  no further uptitration recommended.  She was also referred for followup  blood work, but the patient reports today that she was not able to do  so, citing schedule conflicts.   As before, she continues to deny medication noncompliance, including  added dietary salt.   The patient was diagnosed with a severe migraine headache only a few  days ago, and was treated by Dr. Willaim Bane with Treximet, a sumatriptan  medication.  She took only one dose last evening, with apparent  significant relief.   The patient reports blood pressure readings at home in the 150 - 170  systolic range, 85 - 110 diastolic.  She denies any recent development  of blurred vision or headaches, prior to the recent headache which was  diagnosed as a migraine.   CURRENT MEDICATIONS:  Lisinopril 40 daily, amlodipine 10 daily, and  clonidine 0.3 b.i.d.   PHYSICAL EXAMINATION:  Blood pressure 199/134 initially, repeat 190/129,  and repeat by me of 210/126.  Pulse 93, regular, weight 231 (up 4).  GENERAL:  A 35 year old female, morbidly obese, sitting upright, no  distress.  HEENT:  Normocephalic, atraumatic.  NECK:  Palpable carotid pulses without bruits; no JVD at 90 degrees.  LUNGS:  Clear to auscultation all fields.  HEART:  RRR (S1, S2) no significant murmurs.  No rubs.  ABDOMEN:  Protuberant, nontender.  EXTREMITIES:  No significant edema.  NEURO:  Alert and  oriented.   IMPRESSION:  1. Refractory hypertension.      a.     History of extensive workup excluding secondary etiologies.      b.     Question noncompliance.  2. Recent migraine headache.  3. Morbid obesity.  4. History of anxiety disorder.   PLAN:  1. Following review with Dr. Myrtis Ser, recommendation is to resume      hydrochlorothiazide at 25 mg daily.  The patient apparently had      been on this in the past, but it appears to have been on a p.r.n.      basis.  2. Followup BMET in 1 week, for close monitoring of electrolytes and      renal function.  3. The patient was instructed to keep a blood pressure diary with      twice-daily readings, and to bring this with her at time of next      scheduled visit.  4. Schedule return clinic followup with myself and Dr. Myrtis Ser in 3      weeks.  5. The patient has been  advised to refrain from using Treximet in the      near future, given its potential for exacerbating underlying      hypertension.      Rozell Searing, PA-C  Electronically Signed      Luis Abed, MD, [facility]  Electronically Signed   GS/MedQ  DD: 12/22/2007  DT: 12/22/2007  Job #: [id number]   cc:   Dr. Meredith Mody

## 2010-12-18 NOTE — Assessment & Plan Note (Signed)
[facility]                          [facility]   [name], Ana                         MRN:          [id number]  DATE:01/15/2008                            DOB:          [birth year]    CARDIOLOGIST:  Dr. Willa Rough.   PRIMARY CARE PHYSICIAN:  Dr. Meredith Mody.   REASON FOR VISIT:  Three week follow-up.   HISTORY OF PRESENT ILLNESS:  [name] is a 35 year old female patient  with a history of significant hypertension who has had secondary causes  of hypertension ruled out the past.  We have seen over the last several  weeks with labile hypertension.  When she was last seen in the office by  Mr. Rozell Searing, PA-C she was asked to start on HCTZ 25 mg a day.  The  patient returns today for follow-up.  She does tell me that she feels  fatigued when she takes this on a daily basis.  She does take it from  time to time for pedal edema.  She feels somewhat fatigued with the  medication.  She was recently diagnosed with a sinus infection, and is  taking a Z-Pak.  She denies chest pain, shortness of breath or syncope.  She denies any headaches.   CURRENT MEDICATIONS:  1. Amlodipine 10 mg daily.  2. Clonidine 0.3 mg b.i.d.  3. Lisinopril 40 mg daily.   PHYSICAL EXAM:  She is a well-nourished, well-developed female, in no  distress.  Blood pressure 180/118, pulse 92, repeat blood pressure by me  on the right 158/116, on the left 156/110, pulse 92, weight 223.8  pounds.  HEENT:  Normal.  NECK:  Without JVD.  CARDIAC:  Normal S1-S2, regular rate and rhythm.  LUNGS:  Clear to auscultation bilaterally.  ABDOMEN:  Soft, nontender.  EXTREMITIES:  Without edema.  NEUROLOGIC:  She is alert and oriented x3, cranial nerves II through XII  are grossly intact.   IMPRESSION:  1. Labile hypertension.  2. Migraine headaches.  3. Morbid obesity.  4. History of anxiety disorder.   PLAN:  1. [name] returns the office day for follow-up.  Her  blood pressures      are up yet again.  She was again questioned about dietary and      medication compliance.  She denies noncompliance.  2. I think she would benefit from the addition of spironolactone at 25      mg day.  We will go initiate that medication.  I have asked her to      refrain from taking HCTZ.  3. She will need a follow-up BMET in one week's time to reassess her      renal function and potassium given the initiation of      spironolactone.  She will also need a follow-up blood pressure      check with the nurse.  4. Of note, she did bring a blood pressure diary with her today.  She      had one or two blood pressures that were optimal.  The rest of her  blood pressures were in the 150s to 180s over 90s to 110s.  5. The patient will be brought back in 3 months for follow-up.  Of      note, she asked whether or not she could come off of her clonidine      or at least reduce the dose.  If she has an improvement in her      blood pressure over time we can certainly consider decreasing the      dose.  Of note, we had her on Toprol at one time and did try to      switch over to carvedilol.  She never started this medication.  We      could always consider adding this in order to take her off of      clonidine at some point in time.      Tereso Newcomer, PA-C  Electronically Signed      Luis Abed, MD, [facility]  Electronically Signed   SW/MedQ  DD: 01/15/2008  DT: 01/15/2008  Job #: [id number]   cc:   Meredith Mody, MD

## 2010-12-18 NOTE — Assessment & Plan Note (Signed)
[facility]                          [facility]   CIEARRA, RUFO                         MRN:          [id number]  DATE:11/12/2007                            DOB:          1976/08/05    PRIMARY CARDIOLOGIST:  Luis Abed, MD.   PRIMARY CARE PHYSICIAN:  Dr. Meredith Mody.   REASON FOR VISIT:  Blood pressure followup.   HISTORY OF PRESENT ILLNESS:  Ms. Nolden was just recently seen in the  office on April 7.  She had a longstanding history of significant  hypertension and has undergone prior extensive evaluation based on Dr.  Henrietta Hoover notes.  Her most recent medical regimen has included amlodipine,  clonidine, Toprol XL, twice daily; and lisinopril; as well as Lasix,  although apparently labetalol was to be added to that baseline regimen  based on her last visit.  At that time, she had a blood pressure of  220/140.  Today, she returns and her blood pressure is actually better,  although clearly not optimal at 155/101.  She has not filled her  labetalol, and states that she did not get a prescription.  She also  tells me that she was on labetalol in the past, although had to come off  of it due to significant headaches.  I see blood work done from the 7th  showing a BUN and creatinine of 12 and 0.8.  Sodium of 139, potassium of  4.0, chloride of 104, and a bicarb of 29.  Today, we talked about  continuing to adjust her medications and proceed from there.   ALLERGIES:  PENICILLIN, SULFA DRUGS.   PRESENT MEDICATIONS:  1. Lisinopril 40 mg p.o. daily.  2. Lasix 20 mg p.o. daily.  3. Toprol XL 50 mg p.o. b.i.d.  4. Clonidine 0.1 mg 3 tablets p.o. b.i.d.  5. Amlodipine 10 mg p.o. daily.   REVIEW OF SYSTEMS:  As described in present illness.  No active  headache, no chest pain. or shortness of breath; otherwise negative.   PHYSICAL EXAMINATION:  On examination blood pressure today is 155/101;  heart rate is in the 70s; weight is 227  pounds.  This is an obese woman in no acute distress.  HEENT:  Conjunctivae are normal.  Pharynx clear.  NECK:  Supple.  No elevated jugular venous pressure without bruits.  No  thyromegaly is noted.  LUNGS:  Clear without labored breathing.  CARDIAC EXAM:  Reveals a regular rate and rhythm.  No loud murmur or S3  gallop.  No pericardial rub.  ABDOMEN:  Soft, nontender, normal active bowel sounds.  EXTREMITIES:  No significant pitting edema.  Distal pulses are 2+.  SKIN:  Warm and dry.  MUSCULOSKELETAL:  No kyphosis noted.  NEUROPSYCHIATRIC:  The patient is alert and oriented x3.  Affect is  appropriate.   IMPRESSION AND RECOMMENDATIONS:  Difficult to control hypertension.  My  understanding is that this patient has undergone prior extensive  evaluation for secondary causes, and has been managed medically by Dr.  Myrtis Ser and Dr. Willaim Bane most recently.  In reviewing her medications,  as well  as previous intolerances, my suggestion would be to defer on initiating  labetalol since she did not tolerate this in the past, and rather begin  a trial of Coreg 12.5 mg twice daily in lieu of Toprol XL.  This could  be further advanced as tolerated.  It may be that a medication with  alpha-blocker and beta-blocker properties would to a better job overall.  I would also suggest changing her from Lasix to Aldactone 25 mg a day.  She will need a BMET in one week's time, and we will see her we will see  back in the office for further titration.     Jonelle Sidle, MD  Electronically Signed    SGM/MedQ  DD: 11/12/2007  DT: 11/12/2007  Job #: [id number]   cc:   Luis Abed, MD, [facility]

## 2010-12-18 NOTE — Assessment & Plan Note (Signed)
[facility]                          [facility]   RONNETT, PULLIN                       MRN:          [id number]  DATE:07/16/2007                            DOB:          02/03/1976    I saw Ms. [name] in consultation on June 23, 2007.  She has severe  hypertension.  I called Dr. Willaim Bane later that day, and I also requested  information from several places, and I have received information from  [facility] and from evaluation done in [place].  Today, she returns and,  once again, her blood pressure is significantly elevated.  We had pushed  her clonidine up to 0.2 b.i.d.  In January of 2006, the patient had  arteriogram of the renal arteries.  She had normal selective renal  arteriograms and normal aorta.  Over time, she was assessed in  [place].  She was assessed by Dr. Ronne Binning.  He reviewed the  fact that her angiogram was normal.  She also had renin levels, aldo  levels, and screening for pheochromocytoma, Cushing's, and Khan's  syndrome, and they were all negative.  CT of the abdomen had shown no  adrenal mass.  He questioned that there were some compliance issues over  time.  There was the issue of medicine noncompliance and very high salt  intake playing a role in the flares of her hypertension.   Today, she is here again for followup.   PAST MEDICAL HISTORY:   ALLERGIES:  PENICILLIN, SULFA.   MEDICATIONS:  1. Lisinopril 40.  2. Metoprolol 50.  3. Clonidine 2 b.i.d.   OTHER MEDICAL PROBLEMS:  See the complete list on my note of June 23, 2007.   REVIEW OF SYSTEMS:  She is having some headaches.   PHYSICAL EXAM:  Blood pressure ranges from 190/100 to 230/130.  The patient is oriented to person, time, and place.  Affect is normal.  HEENT:  Reveals no xanthelasma.  She has no extraocular motions.  There  are no carotid bruits.  There is no jugular venous distension.  LUNGS:  Clear.  Respiratory effort is  not labored.  CARDIAC:  Reveals an S1 with an S2.  There are no clicks or significant  murmurs.  She is over weight.   PROBLEMS:  1. History of anxiety attacks.  2. History of urinary tract infection.  3. History of headaches.  4. History of feeling palpitations.  5. History of Cesarean sections.  6. History of toxemia pregnancy in 1996 and 1997.  7. Severe hypertension.  See the discussion above and the review of      data above.  I do not think it would be helpful to repeat any of      her hypertensive workup at this time.  We will push medications and      see if we can make any progress.  Over time, I will then consider      whether there are other consultants that can help me more.  For      now, we will increase her beta blocker to 50  b.i.d. and add Norvasc      10, and continue her other medications, and then I will see her for      early followup.     Luis Abed, MD, [facility]  Electronically Signed    JDK/MedQ  DD: 07/16/2007  DT: 07/17/2007  Job #: [id number]   cc:   Dr. Meredith Mody

## 2010-12-18 NOTE — Assessment & Plan Note (Signed)
[facility]                          [facility]   [name], [name]                         MRN:          [id number]  DATE:11/19/2007                            DOB:          04-02-1976    CARDIOLOGIST:  Luis Abed, MD, [facility].   PRIMARY CARE PHYSICIAN:  Dr. Meredith Mody.   REASON FOR VISIT:  One week followup.   HISTORY OF PRESENT ILLNESS:  [name] is a 35 year old female patient  with difficult to control hypertension whom I saw on November 10, 2007.  We  adjusted her medications and she saw Dr. Diona Browner in followup on April  9.  The patient was asked to start on labetalol on April 7 but she never  started this secondary to a previous history of headaches.  When Dr.  Diona Browner saw her he suggested that she stop metoprolol and Lasix and  start on carvedilol and Aldactone.  The patient has yet to start on  these medications and she returns to the office today for followup.  Interestingly enough, her blood pressure is now near-normal.  She denies  noncompliance.  She denies any dietary indiscretion.  She denies chest  pain, shortness of breath, syncope or near syncope.  She does feel  somewhat fatigued.   CURRENT MEDICATIONS:  1. Lisinopril 40 mg daily.  2. Amlodipine 10 mg daily.  3. Clonidine 0.3 mg b.i.d.   PHYSICAL EXAMINATION:  GENERAL:  She is a well-nourished, well-developed  female in no distress.  VITAL SIGNS:  Blood pressure 141/92, pulse of 80, weight 227 pounds.  HEENT:  Is normal.  NECK:  Without JVD.  CARDIAC:  Normal S1, S2.  Regular rate and rhythm.  LUNGS:  Clear to auscultation bilaterally.  ABDOMEN:  Soft, nontender.  EXTREMITIES:  Without edema.   IMPRESSION:  Hypertension.   PLAN:  [name] returns to the office today for followup.  Her blood  pressure findings over the previous days are somewhat bizarre.  As noted  previously, she has had secondary causes for hypertension ruled out in  the past.  She  has had significantly elevated numbers in the past when  she comes into the office.  We have tried to make medication adjustments  to her medical regimen but none of those medications have been started  yet.  The only change that has occurred is the discontinuation of  metoprolol and Lasix.  I questioned her again today about compliance.  She denies noncompliance.  She does feel somewhat fatigued with the  lower blood pressure now.  I have suggested that we continue on her  clonidine, amlodipine and lisinopril for now.  We will follow up on some  blood work with a BMET, CBC, TSH and a urinalysis to rule out  significant proteinuria.  The patient will keep a blood pressure diary  over the next several weeks.  I have asked her to send [place] a copy in  about 2-3 weeks.  I will have her follow up with Dr. Myrtis Ser in the next 6  weeks.  I have explained to  her that her medications should be taken on  a daily basis without missing any doses.      Tereso Newcomer, PA-C  Electronically Signed      Learta Codding, MD,FACC  Electronically Signed   SW/MedQ  DD: 11/19/2007  DT: 11/19/2007  Job #: [id number]   cc:   Meredith Mody, Dr

## 2010-12-18 NOTE — Assessment & Plan Note (Signed)
[facility]                          [facility]   [name], [name]                         MRN:          [id number]  DATE:09/15/2007                            DOB:          1975/11/08    [name] is seen for follow-up.  See my last note of August 11, 2007.  She has severe hypertension.  We checked a BMET.  Fortunately her renal  function remains quite good.  BUN is 10 with a creatinine of 0.8.  She  takes her blood pressure at home.  She tells me that it is better, and  that it ranges in the range of 150/80.  For her this is excellent.  Today in the office it is much higher.  She is taking her medicines.  She does have an upper respiratory tract infection.  She received a Z-  Pak.  She says she is not taking any cold medicines.  Historically when  she has a cold her blood pressure goes up.  I am not inclined to change  her medicines at this point.   ALLERGIES:  PENICILLIN and SULFA.   MEDICATIONS:  1. Lisinopril 40.  2. Lasix 20.  3. Norvasc 10.  4. Metoprolol 50 b.i.d.  5. Clonidine 0.3 b.i.d.   OTHER MEDICAL PROBLEMS:  See the complete list on my note of July 16, 2007.   REVIEW OF SYSTEMS:  She is actually feeling well other than her  respiratory infection.   PHYSICAL EXAMINATION:  VITAL SIGNS:  Blood pressure today with a large  cuff is checked multiple times, and ultimately it appears to be in the  range of 190/110.  NEUROLOGICAL:  The patient is oriented to person, time, and place.  Affect is normal.  HEENT:  Reveals no xanthelasma.  She has normal extraocular motion.  LUNGS:  Clear.  Respiratory effort is not labored.  CARDIAC:  Reveals S1 with an S2.  There are no clicks or significant  murmurs.  ABDOMEN:  Soft.  EXTREMITIES:  She has no peripheral edema.   PROBLEMS:  Problems are listed on the note of July 16, 2007.   1. Severe hypertension.  We will not change her medicines today.  I am  encouraged to hear that her blood pressure at home is better.  We      are writing down suggestions for her to use for her cold including      the generic of Claritin or Zyrtec without the D component.      Otherwise, she is to take no other cold medicines.   She,of course, could take Robitussin DM or Tylenol or aspirin as needed.     Luis Abed, MD, [facility]  Electronically Signed    JDK/MedQ  DD: 09/15/2007  DT: 09/16/2007  Job #: [id number]   cc:   Meredith Mody, M.D.

## 2011-03-05 ENCOUNTER — Encounter: Payer: Self-pay | Admitting: Cardiology

## 2011-03-14 ENCOUNTER — Ambulatory Visit (INDEPENDENT_AMBULATORY_CARE_PROVIDER_SITE_OTHER): Payer: BC Managed Care – PPO | Admitting: Cardiology

## 2011-03-14 ENCOUNTER — Encounter: Payer: Self-pay | Admitting: *Deleted

## 2011-03-14 ENCOUNTER — Encounter: Payer: Self-pay | Admitting: Cardiology

## 2011-03-14 DIAGNOSIS — R635 Abnormal weight gain: Secondary | ICD-10-CM | POA: Insufficient documentation

## 2011-03-14 DIAGNOSIS — R5383 Other fatigue: Secondary | ICD-10-CM

## 2011-03-14 DIAGNOSIS — I10 Essential (primary) hypertension: Secondary | ICD-10-CM

## 2011-03-14 DIAGNOSIS — R5381 Other malaise: Secondary | ICD-10-CM

## 2011-03-14 NOTE — Progress Notes (Signed)
HPI Patient is seen today for followup of hypertension.  She is actually doing remarkably well.  Her combination of medications is unusual.  However it is working well for her.  Her blood pressure is extremely well controlled now.  She is watching her salt and fluid intake and exercising regularly.  She is having some difficulties with weight gain.  Etiology is not clear.  She says she is watching her diet very carefully. Allergies  Allergen Reactions  . Penicillins   . Sulfonamide Derivatives     Current Outpatient Prescriptions  Medication Sig Dispense Refill  . amLODipine (NORVASC) 10 MG tablet Take 10 mg by mouth daily.        Marland Kitchen azithromycin (ZITHROMAX) 250 MG tablet Take 1 tablet by mouth Daily.      . cloNIDine (CATAPRES) 0.2 MG tablet Take 0.2 mg by mouth daily.       . furosemide (LASIX) 40 MG tablet Take 40 mg by mouth daily as needed.        Marland Kitchen HYDROcodone-acetaminophen (LORTAB) 7.5-500 MG per tablet Take 1 tablet by mouth 2 (two) times daily as needed.       . metoprolol (LOPRESSOR) 50 MG tablet Take 50 mg by mouth 2 (two) times daily.        . nebivolol (BYSTOLIC) 5 MG tablet Take 5 mg by mouth daily.          History   Social History  . Marital Status: Married    Spouse Name: N/A    Number of Children: 2  . Years of Education: N/A   Occupational History  . full time    Social History Main Topics  . Smoking status: Never Smoker   . Smokeless tobacco: Never Used  . Alcohol Use: No  . Drug Use: No  . Sexually Active: Not on file   Other Topics Concern  . Not on file   Social History Narrative  . No narrative on file    Family History  Problem Relation Age of Onset  . Cancer Other     Past Medical History  Diagnosis Date  . Obesity   . Migraine   . Anxiety   . Palpitation   . Ejection fraction < 50%     EF 35-40%...echo...january 27,2011/cardiac cta EF 51%...09/05/2009 LVH..moderate...echo january 2011  . Tricuspid regurgitation     moderate  ...echo..january 2011  . Hypertension     severe,but very responsive to amlodipine and hydrochlorithizide  overweight  . Shortness of breath   . Coronary artery fistula     ??? by echo 08/2009../..cardiac CTA...09/05/2009...no coronary fistula  . UTI (urinary tract infection)     recurrent  . LVH (left ventricular hypertrophy)     Moderate echo, January, 2011  . Pulmonary hypertension     60 mmHg echo January, 2011 / normal pulmonary arteries by CT angiography  . Fluid overload     April, 2012  . Dizziness     Nonspecific, May, 2012, feeling off balance    Past Surgical History  Procedure Date  . Cesarean section   . Tubal ligation     ROS  Patient denies fever, chills, headache, sweats, rash, change in vision, change in hearing, chest pain, cough, nausea vomiting, urinary symptoms.  All other systems are reviewed and are negative.  PHYSICAL EXAM Patient is oriented to person time and place.  Affect is normal.  Head is atraumatic.  Lungs are clear.  Respiratory effort is unlabored.  Cardiac exam  reveals S1 and S2.  No clicks or significant murmurs.  The abdomen is soft there is no peripheral edema. Filed Vitals:   03/14/11 1019  BP: 127/86  Pulse: 76  Height: 5\' 3"  (1.6 m)  Weight: 235 lb (106.595 kg)    EKG is not done today.  ASSESSMENT & PLAN

## 2011-03-14 NOTE — Assessment & Plan Note (Signed)
Etiology of her weight gain is not clear.  It seems very unlikely it is related to any of her medications.  Will check TSH.  She is on a diuretic and chemistry will also be checked.  I will encourage her to try [employer] to see if she can get more ideas about how to lose weight.

## 2011-03-14 NOTE — Assessment & Plan Note (Signed)
Her blood pressure is very well controlled.  It is so difficult to control the pressure that I would not begin to change her medicines at this time

## 2011-03-14 NOTE — Patient Instructions (Signed)
Your physician recommends that you schedule a follow-up appointment in: November 16th 2012 @ 10:00am. Your physician recommends that you return for lab work in: TODAY AT [facility]. Your physician recommends that you continue on your current medications as directed. Please refer to the Current Medication list given to you today.

## 2011-03-19 ENCOUNTER — Telehealth: Payer: Self-pay | Admitting: *Deleted

## 2011-03-19 NOTE — Telephone Encounter (Signed)
Message copied by Eustace Moore on Tue Mar 19, 2011  8:12 AM ------      Message from: [place], [place] D      Created: Fri Mar 15, 2011 10:50 AM       Labs look good

## 2011-03-19 NOTE — Telephone Encounter (Signed)
Pt notified of results and verbalized understanding  

## 2011-03-19 NOTE — Telephone Encounter (Signed)
Left message for patient to call office.  

## 2011-06-21 ENCOUNTER — Ambulatory Visit (INDEPENDENT_AMBULATORY_CARE_PROVIDER_SITE_OTHER): Payer: BC Managed Care – PPO | Admitting: Cardiology

## 2011-06-21 ENCOUNTER — Encounter: Payer: Self-pay | Admitting: *Deleted

## 2011-06-21 ENCOUNTER — Encounter: Payer: Self-pay | Admitting: Cardiology

## 2011-06-21 VITALS — BP 146/99 | HR 72 | Ht 63.0 in | Wt 238.0 lb

## 2011-06-21 DIAGNOSIS — R635 Abnormal weight gain: Secondary | ICD-10-CM

## 2011-06-21 DIAGNOSIS — E8779 Other fluid overload: Secondary | ICD-10-CM

## 2011-06-21 DIAGNOSIS — I10 Essential (primary) hypertension: Secondary | ICD-10-CM

## 2011-06-21 MED ORDER — CLONIDINE HCL 0.2 MG PO TABS: 0.2000 mg | ORAL_TABLET | Freq: Two times a day (BID) | ORAL | Status: DC

## 2011-06-21 NOTE — Progress Notes (Signed)
HPI    Patient is seen for follow up of hypertension and fluid overload.  She has had a history of very severe hypertension.  We've had this under better control.  Unfortunately she's been gaining weight.  There is a fluid component.  There is also a true bodyweight component.  She's been working very hard with her calories.  She has been concerned that weight gain may be related to Bystolic.   Allergies  Allergen Reactions  . Penicillins   . Sulfonamide Derivatives     Current Outpatient Prescriptions  Medication Sig Dispense Refill  . amLODipine (NORVASC) 10 MG tablet Take 10 mg by mouth daily.        . cloNIDine (CATAPRES) 0.2 MG tablet Take 0.2 mg by mouth daily.       . furosemide (LASIX) 40 MG tablet Take 40 mg by mouth daily as needed.        Marland Kitchen HYDROcodone-acetaminophen (LORTAB) 7.5-500 MG per tablet Take 1 tablet by mouth 2 (two) times daily as needed.       . metoprolol (LOPRESSOR) 50 MG tablet Take 50 mg by mouth 2 (two) times daily.        . nebivolol (BYSTOLIC) 5 MG tablet Take 5 mg by mouth daily.          History   Social History  . Marital Status: Married    Spouse Name: N/A    Number of Children: 2  . Years of Education: N/A   Occupational History  . full time    Social History Main Topics  . Smoking status: Never Smoker   . Smokeless tobacco: Never Used  . Alcohol Use: No  . Drug Use: No  . Sexually Active: Not on file   Other Topics Concern  . Not on file   Social History Narrative  . No narrative on file    Family History  Problem Relation Age of Onset  . Cancer Other     Past Medical History  Diagnosis Date  . Obesity   . Migraine   . Anxiety   . Palpitation   . Ejection fraction < 50%     EF 35-40%...echo...january 27,2011/cardiac cta EF 51%...09/05/2009 LVH..moderate...echo january 2011  . Tricuspid regurgitation     moderate ...echo..january 2011  . Hypertension     severe,but very responsive to amlodipine and hydrochlorithizide   overweight  . Shortness of breath   . Coronary artery fistula     ??? by echo 08/2009../..cardiac CTA...09/05/2009...no coronary fistula  . UTI (urinary tract infection)     recurrent  . LVH (left ventricular hypertrophy)     Moderate echo, January, 2011  . Pulmonary hypertension     60 mmHg echo January, 2011 / normal pulmonary arteries by CT angiography  . Fluid overload     April, 2012  . Dizziness     Nonspecific, May, 2012, feeling off balance  . Weight gain     August, 2012    Past Surgical History  Procedure Date  . Cesarean section   . Tubal ligation     ROS    Patient denies fever, chills, headache, sweats, rash, change in vision, change in hearing, chest pain, cough, nausea vomiting, urinary symptoms.  All other systems are reviewed and are negative.  PHYSICAL EXAM    Patient is stable today.  She has trace edema.  She is oriented to person time and place.  Affect is normal.  There is no jugular venous distention.  Lungs are clear.  Respiratory effort is nonlabored.  Cardiac exam is S1 and S2.  There no clicks or significant murmurs.  The abdomen is soft.  There is trace peripheral edema.  Filed Vitals:   06/21/11 1013  BP: 146/99  Pulse: 72  Height: 5\' 3"  (1.6 m)  Weight: 238 lb (107.956 kg)     ASSESSMENT & PLAN

## 2011-06-21 NOTE — Assessment & Plan Note (Signed)
There is some fluid overload.  She will take extra Lasix for 3 days and then cut back to her regular daily dose.

## 2011-06-21 NOTE — Patient Instructions (Signed)
Follow up as scheduled. Medication changes as discussed with Dr. Myrtis Ser.

## 2011-06-21 NOTE — Assessment & Plan Note (Signed)
I believe she is continuing to gain to body weight.  She thinks it is related to Bystolic.  I am not convinced that this is the case but I think it is very reasonable to put this medicine on hold and watch her blood pressure and volume status very carefully.  I will increase her clonidine back to twice a day.  Eventually I will send her a long-acting medicine in his class.

## 2011-07-01 ENCOUNTER — Encounter: Payer: Self-pay | Admitting: Cardiology

## 2011-07-01 ENCOUNTER — Encounter: Payer: Self-pay | Admitting: *Deleted

## 2011-07-01 ENCOUNTER — Ambulatory Visit (INDEPENDENT_AMBULATORY_CARE_PROVIDER_SITE_OTHER): Payer: BC Managed Care – PPO | Admitting: Cardiology

## 2011-07-01 DIAGNOSIS — R002 Palpitations: Secondary | ICD-10-CM

## 2011-07-01 DIAGNOSIS — I10 Essential (primary) hypertension: Secondary | ICD-10-CM

## 2011-07-01 MED ORDER — CLONIDINE HCL 0.2 MG PO TABS: 0.2000 mg | ORAL_TABLET | Freq: Every day | ORAL | Status: DC

## 2011-07-01 MED ORDER — NEBIVOLOL HCL 5 MG PO TABS: 5.0000 mg | ORAL_TABLET | Freq: Every day | ORAL | Status: DC

## 2011-07-01 NOTE — Patient Instructions (Signed)
   Resume Bystolic Follow up in 4-5 weeks - see above.

## 2011-07-01 NOTE — Progress Notes (Signed)
HPI   The patient returns today for followup of hypertension.  She had been concerned that her weight was increasing and thought that it was from [place].  I felt that this was unlikely but agreed to have her hold this for a period of time.  On the short-term we increased her clonidine.  Also there was a question of some volume overload and her Lasix dose was increased for a few days.  In the meantime she developed a urinary tract infection and began her period which affected her overall volume status.  She is on an antibiotic.  She noted that her blood pressure did go up off Bystolic.  She had been very well controlled before.    She has also had some increased palpitations.  Allergies  Allergen Reactions  . Penicillins   . Sulfonamide Derivatives     Current Outpatient Prescriptions  Medication Sig Dispense Refill  . amLODipine (NORVASC) 10 MG tablet Take 10 mg by mouth daily.        . ciprofloxacin (CIPRO) 500 MG tablet Take 1 tablet by mouth Twice daily.      . cloNIDine (CATAPRES) 0.2 MG tablet Take 1 tablet (0.2 mg total) by mouth 2 (two) times daily.      . furosemide (LASIX) 40 MG tablet Take 40 mg by mouth daily as needed.        Marland Kitchen HYDROcodone-acetaminophen (LORTAB) 7.5-500 MG per tablet Take 1 tablet by mouth 2 (two) times daily as needed.       . metoprolol (LOPRESSOR) 50 MG tablet Take 50 mg by mouth 2 (two) times daily.          History   Social History  . Marital Status: Married    Spouse Name: N/A    Number of Children: 2  . Years of Education: N/A   Occupational History  . full time    Social History Main Topics  . Smoking status: Never Smoker   . Smokeless tobacco: Never Used  . Alcohol Use: No  . Drug Use: No  . Sexually Active: Not on file   Other Topics Concern  . Not on file   Social History Narrative  . No narrative on file    Family History  Problem Relation Age of Onset  . Cancer Other     Past Medical History  Diagnosis Date  . Obesity     . Migraine   . Anxiety   . Palpitation   . Ejection fraction < 50%     EF 35-40%...echo...january 27,2011/cardiac cta EF 51%...09/05/2009 LVH..moderate...echo january 2011  . Tricuspid regurgitation     moderate ...echo..january 2011  . Hypertension     severe,but very responsive to amlodipine and hydrochlorithizide  overweight  . Shortness of breath   . Coronary artery fistula     ??? by echo 08/2009../..cardiac CTA...09/05/2009...no coronary fistula  . UTI (urinary tract infection)     recurrent  . LVH (left ventricular hypertrophy)     Moderate echo, January, 2011  . Pulmonary hypertension     60 mmHg echo January, 2011 / normal pulmonary arteries by CT angiography  . Fluid overload     April, 2012  . Dizziness     Nonspecific, May, 2012, feeling off balance  . Weight gain     August, 2012    Past Surgical History  Procedure Date  . Cesarean section   . Tubal ligation     ROS    Patient denies fever, chills,  headache, sweats, rash, change in vision, change in hearing, chest pain, cough, nausea vomiting,.  Her urinary symptoms are treated.  All of the systems are reviewed and are negative.  PHYSICAL EXAM  Patient is stable.  Head is atraumatic.  There is no jugulovenous distention.  Lungs are clear.  Respiratory effort is nonlabored.  Cardiac exam reveals S1-S2.  There are no clicks or significant murmurs.  The abdomen is soft.  There is no peripheral edema.  Filed Vitals:   07/01/11 0819 07/01/11 0822  BP: 158/101 143/90  Pulse:  75  Height: 5\' 3"  (1.6 m)   Weight: 238 lb (107.956 kg)      ASSESSMENT & PLAN

## 2011-07-01 NOTE — Assessment & Plan Note (Addendum)
The plan for today will be restarted Bystolic.  I will then see her back in several weeks.  Consideration then will be given to the possibility of using Tenex.  Also we need to have significant beta blockade on board as she has had recent increase in her palpitations.

## 2011-07-01 NOTE — Assessment & Plan Note (Signed)
Patient has had an increase in her palpitations since being off Bystolic.  We will follow this and she is back on the Bystplic

## 2011-07-12 IMAGING — CT CT HEART MORP W/ CTA COR W/ SCORE W/ CA W/CM &/OR W/O CM
1 of 2 series · 8 of 20 positions shown, 10 images · IV contrast (APPLIED)
Comparison: None
COMPARISON: None

Addendum Begins
OVER-READ INTERPRETATION - CT CHEST

The following report is an over-read performed by radiologist Dr.
[DATE].  This over-read does not include interpretation of
cardiac or coronary anatomy or pathology.  The CTA interpretation
by the cardiologist is attached.
INDICATION: R/O coronary artery fistula
PROTOCOL: The patient was scanned on a Siemens Sensation 64 slice
scanner. Dr. Ferreyra was present for preperation.  The patient
received 10mg of i.v. Lopresser.  Average heart rate during the
scan was in the low 70's bpm.  No nitro was given as the patient
indicated a previous bad reaction.  After an AP and lateral
topogram calcium scoring was done in the axial plane using 3mm non-
contrast images.  The patient received 20cc of contrast for timing
bolus with Teruaki Chuah in the ascending aorta.  80cc of contrast was used
for coronary CTA.  The images were sent to [employer] [employer] [facility] work-
station for reconstruction using VRT, MIP and MPR modes.

[Series 7: soft tissue · axial · 0.85mm/px · z∈[-252,-128]mm · 8 of 53 slices shown, 10 images]
[im 6/53  vessel]
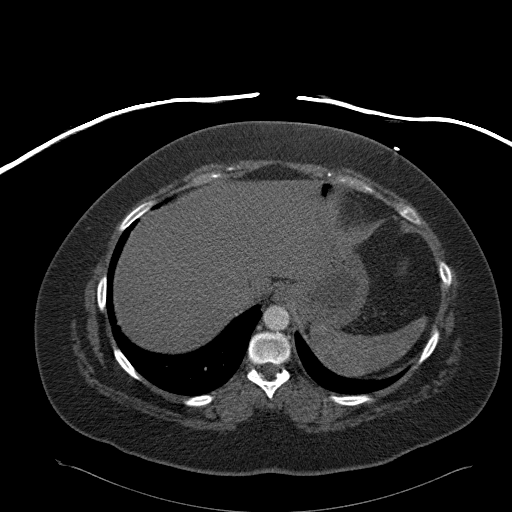
[im 6/53  lung]
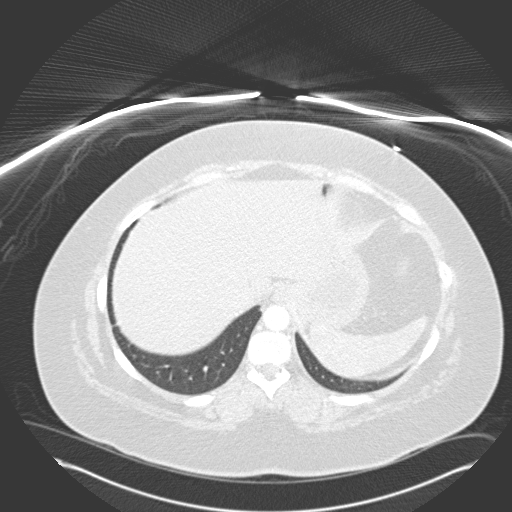
[im 12/53  vessel]
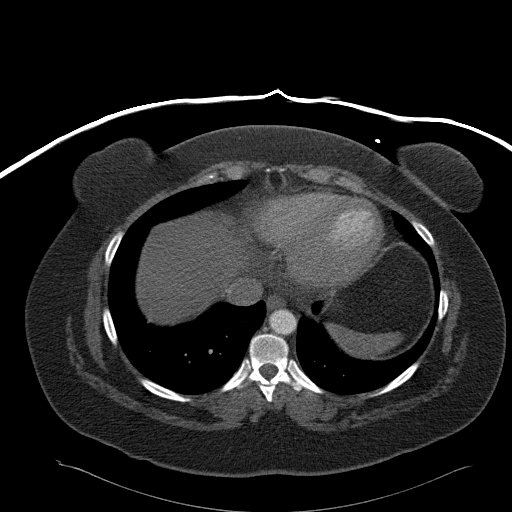
[im 18/53  vessel]
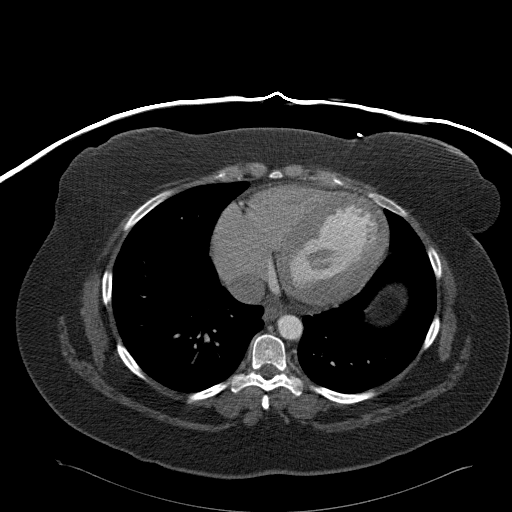
[im 24/53  vessel]
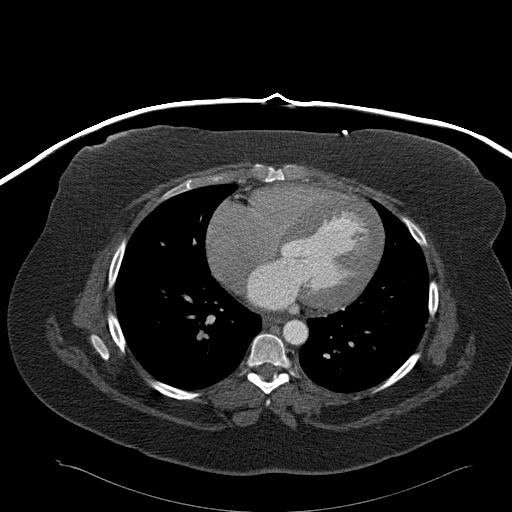
[im 29/53  vessel]
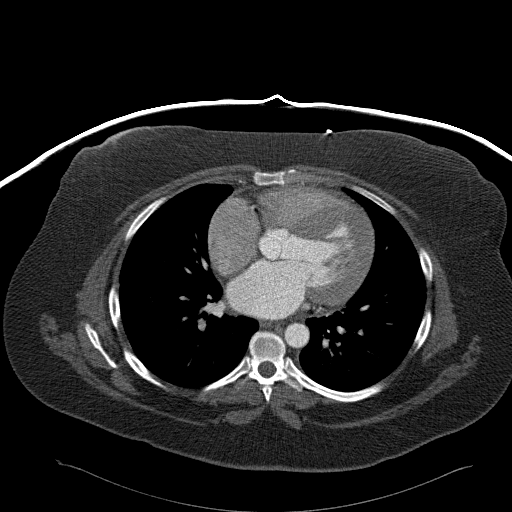
[im 29/53  lung]
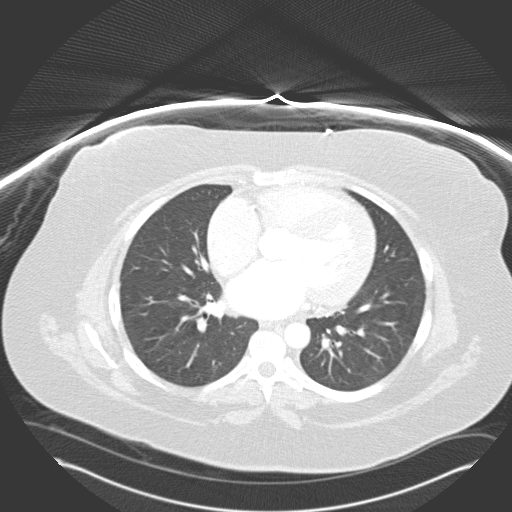
[im 35/53  vessel]
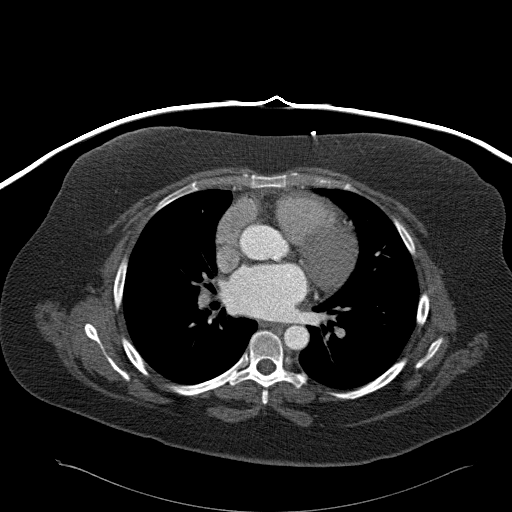
[im 41/53  vessel]
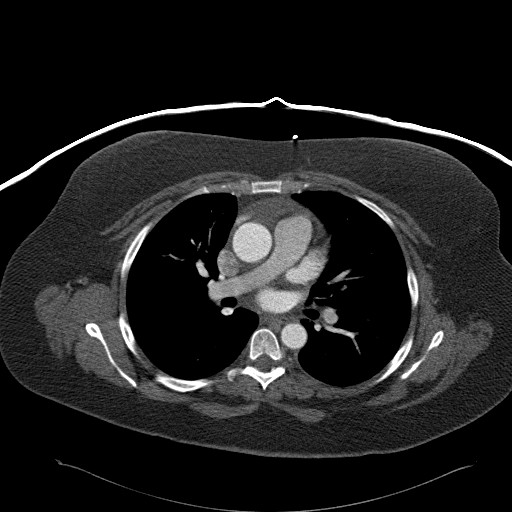
[im 47/53  vessel]
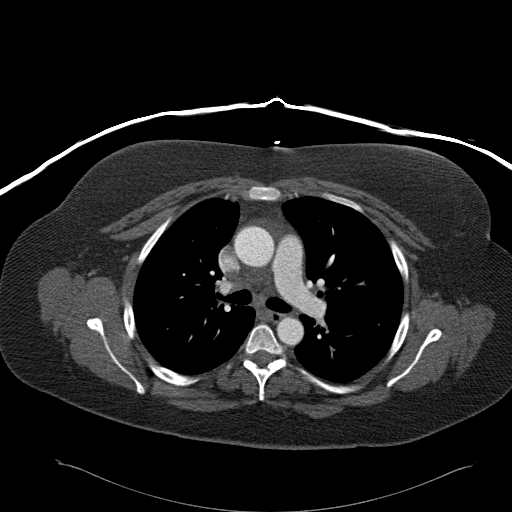

[8 of 20 positions shown; findings below may reference images not displayed]

FINDINGS: Lung windows demonstrate no nodules or airspace
opacities.

Soft tissue windows demonstrate no central pulmonary embolism.
Normal size of the pulmonary arteries (despite the submitted
clinical history of pulmonary arterial hypertension).

No pericardial or pleural effusion.  Normal caliber thoracic aorta
without dissection. No mediastinal or hilar adenopathy. A tiny
hiatal hernia.

Limited abdominal imaging demonstrates no significant findings.
IMPRESSION: A tiny hiatal hernia.  Otherwise, no significant extracardiac
findings within the imaged chest.

Cardiac CT:
FINDINGS: Non-cardiac: These were also reviewed by Dr Diogo Jose Vanzeller from
[REDACTED]. The patient had a small hiatal hernia and no
other significant findings

Non-coronary cardiac:  There was significant LVH with a septal
thickness of 17mm.  LV cavity size was normal.  Quantitative EF was
51% with no discrete RWMA's.  The LA/RA/RV were normal size.  There
was no ASD or VSD.  The pericardium was normal.  There was no
abnormal flow seen in the RVOT.  Pulmonary veins drained normally
into the LA.  There was a right middle pulmonary vein.  The aortic
valve was Vesta and trileaflet.  Coarctation could not be R/O as
the scan did not encompass the aortic arch and left sublavian area.

Coronary CTA:  The patient had no coronary anomalies.  The Left
Main came from the left cusp and the right coronary was dominant
and came from the right cusp.  The LAD, D1,D2, circumflex and OM1
were normal.  The RCA was normal with a very tortuous segment
proximally.  The coronary calcium score was 0

Impression;

   1)    Normal right dominant coronary arteries with no fistula
      identified

2)    Severe LVH with EF 51%
3)    Coronary Calcium Score 0
4)    Small hiatal hernia with no other significant non cardiac
findings.

Addendum Ends
OVER-READ INTERPRETATION - CT CHEST

The following report is an over-read performed by radiologist Dr.
[DATE].  This over-read does not include interpretation of
cardiac or coronary anatomy or pathology.  The CTA interpretation
by the cardiologist is attached.
FINDINGS: Lung windows demonstrate no nodules or airspace
opacities.

Soft tissue windows demonstrate no central pulmonary embolism.
Normal size of the pulmonary arteries (despite the submitted
clinical history of pulmonary arterial hypertension).

No pericardial or pleural effusion.  Normal caliber thoracic aorta
without dissection. No mediastinal or hilar adenopathy. A tiny
hiatal hernia.

Limited abdominal imaging demonstrates no significant findings.
IMPRESSION: A tiny hiatal hernia.  Otherwise, no significant extracardiac
findings within the imaged chest.

## 2011-07-29 ENCOUNTER — Ambulatory Visit: Payer: BC Managed Care – PPO | Admitting: Cardiology

## 2011-08-14 ENCOUNTER — Encounter: Payer: Self-pay | Admitting: Cardiology

## 2011-08-14 ENCOUNTER — Encounter: Payer: Self-pay | Admitting: *Deleted

## 2011-08-14 ENCOUNTER — Ambulatory Visit (INDEPENDENT_AMBULATORY_CARE_PROVIDER_SITE_OTHER): Payer: BC Managed Care – PPO | Admitting: Cardiology

## 2011-08-14 DIAGNOSIS — R9430 Abnormal result of cardiovascular function study, unspecified: Secondary | ICD-10-CM

## 2011-08-14 DIAGNOSIS — E8779 Other fluid overload: Secondary | ICD-10-CM

## 2011-08-14 DIAGNOSIS — I10 Essential (primary) hypertension: Secondary | ICD-10-CM

## 2011-08-14 DIAGNOSIS — R635 Abnormal weight gain: Secondary | ICD-10-CM

## 2011-08-14 DIAGNOSIS — R0989 Other specified symptoms and signs involving the circulatory and respiratory systems: Secondary | ICD-10-CM

## 2011-08-14 NOTE — Assessment & Plan Note (Signed)
The patient appears to be motivated and is trying to exercise. I in trying to give her the best advice possible concerning dieting. I have recommended Weight [employer]chers.

## 2011-08-14 NOTE — Assessment & Plan Note (Signed)
Two-dimensional echo be done to reassess LV function

## 2011-08-14 NOTE — Assessment & Plan Note (Signed)
Fluid status is stable.  No change in therapy. 

## 2011-08-14 NOTE — Progress Notes (Signed)
HPI  Patient is seen for followup of hypertension. We restarted her by systolic. Her blood pressures under good control now. Unfortunately she continues to gain weight . I have recommended [employer]. She is not able to get to the classes and she does not have a computer to do an online. She has had some unusual sensations of feeling weak and nauseated late at night. She is able to go to bed and feel better. This does not appear to be CHF.  She did have a urinary tract infection that was treated.   Allergies  Allergen Reactions  . Penicillins   . Sulfonamide Derivatives     Current Outpatient Prescriptions  Medication Sig Dispense Refill  . amLODipine (NORVASC) 10 MG tablet Take 10 mg by mouth daily.        . cloNIDine (CATAPRES) 0.2 MG tablet Take 1 tablet (0.2 mg total) by mouth daily.      . furosemide (LASIX) 40 MG tablet Take 40 mg by mouth daily as needed.        . metoprolol (LOPRESSOR) 50 MG tablet Take 50 mg by mouth 2 (two) times daily.        . nebivolol (BYSTOLIC) 5 MG tablet Take 1 tablet (5 mg total) by mouth daily.        History   Social History  . Marital Status: Married    Spouse Name: N/A    Number of Children: 2  . Years of Education: N/A   Occupational History  . full time    Social History Main Topics  . Smoking status: Never Smoker   . Smokeless tobacco: Never Used  . Alcohol Use: No  . Drug Use: No  . Sexually Active: Not on file   Other Topics Concern  . Not on file   Social History Narrative  . No narrative on file    Family History  Problem Relation Age of Onset  . Cancer Other     Past Medical History  Diagnosis Date  . Obesity   . Migraine   . Anxiety   . Palpitation   . Ejection fraction < 50%     EF 35-40%...echo...january 27,2011/cardiac cta EF 51%...09/05/2009 LVH..moderate...echo january 2011  . Tricuspid regurgitation     moderate ...echo..january 2011  . Hypertension     severe,but very responsive to amlodipine and  hydrochlorithizide  overweight  . Shortness of breath   . Coronary artery fistula     ??? by echo 08/2009../..cardiac CTA...09/05/2009...no coronary fistula  . UTI (urinary tract infection)     recurrent  . LVH (left ventricular hypertrophy)     Moderate echo, January, 2011  . Pulmonary hypertension     60 mmHg echo January, 2011 / normal pulmonary arteries by CT angiography  . Fluid overload     April, 2012  . Dizziness     Nonspecific, May, 2012, feeling off balance  . Weight gain     August, 2012    Past Surgical History  Procedure Date  . Cesarean section   . Tubal ligation     ROS   Patient denies fever, chills, headache, sweats, rash, change in vision, change in hearing, chest pain, cough.  PHYSICAL EXAM Patient is oriented to person time and place. Affect is normal. Head is atraumatic. There is no xanthelasma. Lungs are clear. Respiratory effort is nonlabored. Cardiac exam reveals S1 and S2. There are no clicks or significant murmurs. The abdomen is soft. There is no peripheral edema.  Filed Vitals:   08/14/11 1329  BP: 147/92  Pulse: 73  Height: 5\' 3"  (1.6 m)  Weight: 242 lb (109.77 kg)    ASSESSMENT & PLAN

## 2011-08-14 NOTE — Patient Instructions (Signed)
Follow up as scheduled. Your physician recommends that you continue on your current medications as directed. Please refer to the Current Medication list given to you today. Your physician has requested that you have an echocardiogram. Echocardiography is a painless test that uses sound waves to create images of your heart. It provides your doctor with information about the size and shape of your heart and how well your heart's chambers and valves are working. This procedure takes approximately one hour. There are no restrictions for this procedure. If the results of your test are normal or stable, you will receive a letter. If they are abnormal, the nurse will contact you by phone. 

## 2011-08-14 NOTE — Assessment & Plan Note (Signed)
Her blood pressure is under good control for her at this time. No change in therapy.

## 2011-08-22 ENCOUNTER — Other Ambulatory Visit (INDEPENDENT_AMBULATORY_CARE_PROVIDER_SITE_OTHER): Payer: BC Managed Care – PPO | Admitting: *Deleted

## 2011-08-22 ENCOUNTER — Encounter: Payer: Self-pay | Admitting: *Deleted

## 2011-08-22 DIAGNOSIS — R002 Palpitations: Secondary | ICD-10-CM

## 2011-08-22 DIAGNOSIS — I10 Essential (primary) hypertension: Secondary | ICD-10-CM

## 2011-08-29 ENCOUNTER — Telehealth: Payer: Self-pay | Admitting: *Deleted

## 2011-08-29 NOTE — Telephone Encounter (Signed)
Pt notified of results and verbalized understanding  

## 2011-08-29 NOTE — Telephone Encounter (Signed)
Message copied by Arlyss Gandy on Thu Aug 29, 2011  4:53 PM ------      Message from: Myrtis Ser, [place] D      Created: Thu Aug 29, 2011  7:55 AM       Please call to tell her that echo is very good. I am very pleased. Keeping her BP controlled has had a huge positive effect on her heart.

## 2011-10-11 ENCOUNTER — Encounter: Payer: Self-pay | Admitting: *Deleted

## 2011-10-11 ENCOUNTER — Encounter: Payer: Self-pay | Admitting: Cardiology

## 2011-10-11 ENCOUNTER — Ambulatory Visit (INDEPENDENT_AMBULATORY_CARE_PROVIDER_SITE_OTHER): Payer: BC Managed Care – PPO | Admitting: Cardiology

## 2011-10-11 VITALS — BP 152/102 | HR 86 | Ht 63.0 in | Wt 242.1 lb

## 2011-10-11 DIAGNOSIS — I10 Essential (primary) hypertension: Secondary | ICD-10-CM

## 2011-10-11 DIAGNOSIS — I079 Rheumatic tricuspid valve disease, unspecified: Secondary | ICD-10-CM

## 2011-10-11 DIAGNOSIS — R635 Abnormal weight gain: Secondary | ICD-10-CM

## 2011-10-11 DIAGNOSIS — I2789 Other specified pulmonary heart diseases: Secondary | ICD-10-CM

## 2011-10-11 DIAGNOSIS — I071 Rheumatic tricuspid insufficiency: Secondary | ICD-10-CM

## 2011-10-11 DIAGNOSIS — I272 Pulmonary hypertension, unspecified: Secondary | ICD-10-CM

## 2011-10-11 MED ORDER — NEBIVOLOL HCL 5 MG PO TABS: 10.0000 mg | ORAL_TABLET | Freq: Every day | ORAL | Status: DC

## 2011-10-11 NOTE — Assessment & Plan Note (Signed)
Weight gain has stabilize. I am hopeful that with increased exercise she will now begin to lose weight. She understands that this will also help with her blood pressure.

## 2011-10-11 NOTE — Patient Instructions (Signed)
   Follow up with Dr. Myrtis Ser in 8 weeks.  Increase Bystolic to 5 mg 2 tablets daily. If you are not able to tolerate this change, notify the office and return to 1 tablet daily.

## 2011-10-11 NOTE — Assessment & Plan Note (Signed)
It appears that by reducing her blood pressure she has had significant improvement in LV function. We have made major changes in her blood pressure. I do not feel that we have to push for absolute guideline numbers. However her diastolic continues to remain mildly elevated. We will try to push the dose of her by systolic from 5-10.

## 2011-10-11 NOTE — Progress Notes (Signed)
HPI Patient is seen to followup severe hypertension and left ventricular dysfunction. I saw her last January, 2013. We decided it was time to repeat an echo. She has had dramatic improvement. Her EF is now normal. Her mitral regurgitation reduced significantly. There is no tricuspid regurgitation jet so I cannot be sure about her right heart pressures. However this argues against significant pulmonary hypertension. It seems that all of her problems are related to her hypertension. She has had dramatic improvement over time but she still has mild residual hypertension.  Allergies  Allergen Reactions  . Penicillins   . Sulfonamide Derivatives     Current Outpatient Prescriptions  Medication Sig Dispense Refill  . amLODipine (NORVASC) 10 MG tablet Take 10 mg by mouth daily.        . cloNIDine (CATAPRES) 0.2 MG tablet Take 1 tablet (0.2 mg total) by mouth daily.      . metoprolol (LOPRESSOR) 50 MG tablet Take 50 mg by mouth daily.       . nebivolol (BYSTOLIC) 5 MG tablet Take 2 tablets (10 mg total) by mouth daily.  60 tablet  6  . furosemide (LASIX) 40 MG tablet Take 40 mg by mouth daily as needed.          History   Social History  . Marital Status: Married    Spouse Name: N/A    Number of Children: 2  . Years of Education: N/A   Occupational History  . full time    Social History Main Topics  . Smoking status: Never Smoker   . Smokeless tobacco: Never Used  . Alcohol Use: No  . Drug Use: No  . Sexually Active: Not on file   Other Topics Concern  . Not on file   Social History Narrative  . No narrative on file    Family History  Problem Relation Age of Onset  . Cancer Other     Past Medical History  Diagnosis Date  . Obesity   . Migraine   . Anxiety   . Palpitation   . Ejection fraction < 50%     EF 35-40%...echo...january 27,2011/cardiac cta EF 51%...09/05/2009 LVH..moderate...echo january 2011  . Tricuspid regurgitation     moderate ...echo..january 2011    . Hypertension     severe,but very responsive to amlodipine and hydrochlorithizide  overweight  . Shortness of breath   . Coronary artery fistula     ??? by echo 08/2009../..cardiac CTA...09/05/2009...no coronary fistula  . UTI (urinary tract infection)     recurrent  . LVH (left ventricular hypertrophy)     Moderate echo, January, 2011  . Pulmonary hypertension     60 mmHg echo January, 2011 / normal pulmonary arteries by CT angiography  . Fluid overload     April, 2012  . Dizziness     Nonspecific, May, 2012, feeling off balance  . Weight gain     August, 2012    Past Surgical History  Procedure Date  . Cesarean section   . Tubal ligation     ROS  Patient denies fever, chills, headache, sweats, rash, change in vision, change in hearing, chest pain, cough, nausea vomiting, urinary symptoms. All other systems are reviewed and are negative.   PHYSICAL EXAM Patient looks great. Her weight is the same as last visit. This is good for her. She says that she has cut out her caffeine and that this has helped overall. She thinks it's helped with decreased swelling. I told  her I cannot be sure about that. She is oriented to person time and place. Affect is normal. Head is atraumatic. There is no jugulovenous distention. Lungs are clear. Respiratory effort is nonlabored. Cardiac exam reveals S1 and S2. There no clicks or significant murmurs. The abdomen is soft. She does not have any significant peripheral edema at this time.  Filed Vitals:   10/11/11 1356  BP: 152/102  Pulse: 86  Height: 5\' 3"  (1.6 m)  Weight: 242 lb 1.9 oz (109.825 kg)    ASSESSMENT & PLAN

## 2011-10-14 ENCOUNTER — Other Ambulatory Visit: Payer: Self-pay | Admitting: *Deleted

## 2011-10-14 MED ORDER — NEBIVOLOL HCL 5 MG PO TABS: 10.0000 mg | ORAL_TABLET | Freq: Every day | ORAL | Status: DC

## 2011-10-17 ENCOUNTER — Telehealth: Payer: Self-pay | Admitting: *Deleted

## 2011-10-17 ENCOUNTER — Encounter: Payer: Self-pay | Admitting: *Deleted

## 2011-10-17 NOTE — Telephone Encounter (Signed)
Pt states she increased dose of Bystolic as per Dr. Henrietta Hoover instruction. She has been feeling tired and run down since increasing this. She missed work today and is asking for a note to be faxed to [facility] at [phone].

## 2011-12-23 ENCOUNTER — Ambulatory Visit (INDEPENDENT_AMBULATORY_CARE_PROVIDER_SITE_OTHER): Payer: BC Managed Care – PPO | Admitting: Cardiology

## 2011-12-23 ENCOUNTER — Encounter: Payer: Self-pay | Admitting: Cardiology

## 2011-12-23 ENCOUNTER — Encounter: Payer: Self-pay | Admitting: *Deleted

## 2011-12-23 VITALS — BP 137/95 | HR 91 | Ht 63.0 in | Wt 242.0 lb

## 2011-12-23 DIAGNOSIS — R9430 Abnormal result of cardiovascular function study, unspecified: Secondary | ICD-10-CM

## 2011-12-23 DIAGNOSIS — R0989 Other specified symptoms and signs involving the circulatory and respiratory systems: Secondary | ICD-10-CM

## 2011-12-23 DIAGNOSIS — I10 Essential (primary) hypertension: Secondary | ICD-10-CM

## 2011-12-23 MED ORDER — METOPROLOL SUCCINATE ER 50 MG PO TB24: 50.0000 mg | ORAL_TABLET | Freq: Every day | ORAL | Status: DC

## 2011-12-23 MED ORDER — GUANFACINE HCL 1 MG PO TABS: 1.0000 mg | ORAL_TABLET | Freq: Every day | ORAL | Status: DC

## 2011-12-23 NOTE — Progress Notes (Signed)
HPI  Patient is seen today for followup hypertension. Unfortunately there has been a dramatic increase in the cost of her Bystolic. We are helping her look into this. I would like to increase her metoprolol. She has been on 50 twice a day in the past. For some reason his is listed as 50 once a day at this time. She thinks it was causing some headaches. I cannot document this. She is on low-dose clonidine. I have been considering changing her to a better long acting central agent,Tenex. We looked up the cost. This will be very low cost and we'll start at 1 mg at night.     Allergies  Allergen Reactions  . Penicillins   . Sulfonamide Derivatives     Current Outpatient Prescriptions  Medication Sig Dispense Refill  . amLODipine (NORVASC) 10 MG tablet Take 10 mg by mouth daily.        . cloNIDine (CATAPRES) 0.2 MG tablet Take 1 tablet (0.2 mg total) by mouth daily.      . metoprolol (LOPRESSOR) 50 MG tablet Take 50 mg by mouth daily.       . nebivolol (BYSTOLIC) 5 MG tablet Take 5 mg by mouth daily.      Marland Kitchen DISCONTD: nebivolol (BYSTOLIC) 5 MG tablet Take 2 tablets (10 mg total) by mouth daily.  60 tablet  6  . furosemide (LASIX) 40 MG tablet Take 40 mg by mouth daily as needed.          History   Social History  . Marital Status: Married    Spouse Name: N/A    Number of Children: 2  . Years of Education: N/A   Occupational History  . full time    Social History Main Topics  . Smoking status: Never Smoker   . Smokeless tobacco: Never Used  . Alcohol Use: No  . Drug Use: No  . Sexually Active: Not on file   Other Topics Concern  . Not on file   Social History Narrative  . No narrative on file    Family History  Problem Relation Age of Onset  . Cancer Other     Past Medical History  Diagnosis Date  . Obesity   . Migraine   . Anxiety   . Palpitation   . Ejection fraction < 50%     EF 35-40%...echo...january 27,2011/cardiac cta EF 51%...09/05/2009 LVH..moderate...echo  january 2011  . Tricuspid regurgitation     moderate ...echo..january 2011  . Hypertension     severe,but very responsive to amlodipine and hydrochlorithizide  overweight  . Shortness of breath   . Coronary artery fistula     ??? by echo 08/2009../..cardiac CTA...09/05/2009...no coronary fistula  . UTI (urinary tract infection)     recurrent  . LVH (left ventricular hypertrophy)     Moderate echo, January, 2011  . Pulmonary hypertension     60 mmHg echo January, 2011 / normal pulmonary arteries by CT angiography  . Fluid overload     April, 2012  . Dizziness     Nonspecific, May, 2012, feeling off balance  . Weight gain     August, 2012    Past Surgical History  Procedure Date  . Cesarean section   . Tubal ligation     ROS   Patient denies fever, chills, headache, sweats, rash, change in vision, change in hearing, chest pain, cough, nausea vomiting, urinary symptoms. All other systems are reviewed and are negative.  PHYSICAL EXAM  Patient is stable  today. She is oriented to person time and place. Affect is normal. Lungs are clear. Respiratory effort is nonlabored. There is no jugular venous distention. Cardiac exam reveals S1 and S2. There no clicks or significant murmurs. The abdomen is soft. Is no peripheral edema.  Filed Vitals:   12/23/11 1453  BP: 137/95  Pulse: 91  Height: 5\' 3"  (1.6 m)  Weight: 242 lb (109.77 kg)     ASSESSMENT & PLAN

## 2011-12-23 NOTE — Assessment & Plan Note (Addendum)
Her blood pressure is under reasonable control for her. It is now time to stop her clonidine which is very short acting. She takes one dose at night. I will start her on Tenex 1 mg at night. Also when her by bystolic runs out we will see she can tolerate metoprolol 50 twice a day. She says that she takes all of her medicines at night. Metoprolol in the daytime makes her feel fatigued. I will see if she can be switched to metoprolol succinate at night on a once a day basis instead of metoprolol tartrate.  During the visit today, I spent an extensive amount of time trying to review old potential side effects from medicines. I have discussed at great length with her the rationale for changing from clonidine to Tenex. I have also explained to her the rationale for using metoprolol succinate instead of tartrate. I am hopeful that she will be stable off bystolic. In addition we have called her pharmacy on multiple occasions to find a pricing for her medications. We have found good alternative drugs that should be priced very well.

## 2011-12-23 NOTE — Assessment & Plan Note (Signed)
Patient had excellent improvement in her LV function. No further followup at this time. Blood pressure control is the key issue.

## 2011-12-23 NOTE — Patient Instructions (Signed)
Your physician recommends that you schedule a follow-up appointment in: 2 months. Your appointment will be given at the check out desk today.  Your physician has recommended you make the following change in your medication:  STOP CLONIDINE AND START TENEX 1 MG AT BEDTIME FINISH YOUR CURRENT SUPPLY OF BYSTOLIC WITH METOPROLOL TARTRATE, THEN STOP BOTH OF THESE MEDICATIONS AND START METOPROLOL SUCCINATE 50 MG DAILY.  Please monitor your blood pressure readings at home and send the results to our office via fax, phone or mail.

## 2012-02-05 ENCOUNTER — Other Ambulatory Visit: Payer: Self-pay | Admitting: Cardiology

## 2012-02-07 NOTE — Telephone Encounter (Signed)
Fax Received. Refill Completed. [name] (R.M.A)   

## 2012-02-14 ENCOUNTER — Encounter: Payer: Self-pay | Admitting: *Deleted

## 2012-02-14 ENCOUNTER — Ambulatory Visit (INDEPENDENT_AMBULATORY_CARE_PROVIDER_SITE_OTHER): Payer: BC Managed Care – PPO | Admitting: Cardiology

## 2012-02-14 ENCOUNTER — Encounter: Payer: Self-pay | Admitting: Cardiology

## 2012-02-14 VITALS — BP 137/90 | HR 78 | Ht 63.0 in | Wt 246.1 lb

## 2012-02-14 DIAGNOSIS — I10 Essential (primary) hypertension: Secondary | ICD-10-CM

## 2012-02-14 NOTE — Assessment & Plan Note (Signed)
Patient appears to be tolerating tenex after initially not feeling well. However she has some edema. Edema is not listed is a common side effect of this medicine. I decided not to stop it as of today. I am encouraging her to take her Lasix every day. In 1 week she will call us back with her fluid status and weight and will make further decisions. I'll see her back in 6 weeks but we will communicate by telephone.

## 2012-02-14 NOTE — Progress Notes (Signed)
HPI   Patient is seen today to followup hypertension. When I saw her last we stopped her clonidine and changed her to tenex. Also will out her by systolic to run out and she was started on metoprolol at nighttime. Her blood pressure is under reasonable control. She felt that she did have some mood changes that have now stabilized. However she has had recurrent problems with some swelling. Her weight is up 4 pounds. We encouraged her over the phone to use Lasix as needed. She does say she responds to it.  Allergies  Allergen Reactions  . Penicillins   . Sulfonamide Derivatives     Current Outpatient Prescriptions  Medication Sig Dispense Refill  . amLODipine (NORVASC) 10 MG tablet Take 10 mg by mouth daily.        . furosemide (LASIX) 40 MG tablet TAKE 1 TABLET BY MOUTH EVERY DAY  30 tablet  5  . guanFACINE (TENEX) 1 MG tablet Take 1 tablet (1 mg total) by mouth at bedtime.  30 tablet  6  . hydrocodone-acetaminophen (LORCET-HD) 5-500 MG per capsule Take 1 capsule by mouth daily as needed.      . metoprolol (LOPRESSOR) 50 MG tablet Take 50 mg by mouth daily. STOP ONCE BYSTOLIC IS COMPLETE AND START METOPROLOL SUCCINATE 50 MG DAILY      . furosemide (LASIX) 40 MG tablet Take 40 mg by mouth daily as needed.          History   Social History  . Marital Status: Married    Spouse Name: N/A    Number of Children: 2  . Years of Education: N/A   Occupational History  . full time    Social History Main Topics  . Smoking status: Never Smoker   . Smokeless tobacco: Never Used  . Alcohol Use: No  . Drug Use: No  . Sexually Active: Not on file   Other Topics Concern  . Not on file   Social History Narrative  . No narrative on file    Family History  Problem Relation Age of Onset  . Cancer Other     Past Medical History  Diagnosis Date  . Obesity   . Migraine   . Anxiety   . Palpitation   . Ejection fraction < 50%     EF 35-40%...echo...january 27,2011/cardiac cta EF  51%...09/05/2009 LVH..moderate...echo january 2011  . Tricuspid regurgitation     moderate ...echo..january 2011  . Hypertension     severe,but very responsive to amlodipine and hydrochlorithizide  overweight  . Shortness of breath   . Coronary artery fistula     ??? by echo 08/2009../..cardiac CTA...09/05/2009...no coronary fistula  . UTI (urinary tract infection)     recurrent  . LVH (left ventricular hypertrophy)     Moderate echo, January, 2011  . Pulmonary hypertension     60 mmHg echo January, 2011 / normal pulmonary arteries by CT angiography  . Fluid overload     April, 2012  . Dizziness     Nonspecific, May, 2012, feeling off balance  . Weight gain     August, 2012    Past Surgical History  Procedure Date  . Cesarean section   . Tubal ligation     ROS  Patient denies fever, chills, headache, sweats, rash, change in vision, change in hearing, chest pain, cough, nausea vomiting, urinary symptoms. All other systems are reviewed and are negative.  PHYSICAL EXAM Patient's weight is up 4 pounds. She is overweight in  general. Lungs are clear. Respiratory effort is nonlabored. Cardiac exam reveals S1 and S2. There no clicks or significant murmurs.The abdomen is soft. There is trace peripheral edema.  Filed Vitals:   02/14/12 1303  BP: 137/90  Pulse: 78  Height: 5\' 3"  (1.6 m)  Weight: 246 lb 1.9 oz (111.639 kg)  SpO2: 100%     ASSESSMENT & PLAN

## 2012-02-14 NOTE — Patient Instructions (Addendum)
Your physician recommends that you schedule a follow-up appointment in: 6 weeks with Dr. Myrtis Ser.  Your physician has recommended you make the following change in your medication: increased furosemide 40 mg to daily. All other medications are still the same.   Please call/fax our office in 1 week with results of weights and swelling.

## 2012-02-14 NOTE — Assessment & Plan Note (Signed)
I have instructed her to take her Lasix every day and she will call us back in one week.

## 2012-02-17 ENCOUNTER — Telehealth: Payer: Self-pay | Admitting: *Deleted

## 2012-02-17 ENCOUNTER — Encounter: Payer: Self-pay | Admitting: *Deleted

## 2012-02-17 DIAGNOSIS — I10 Essential (primary) hypertension: Secondary | ICD-10-CM

## 2012-02-17 NOTE — Telephone Encounter (Signed)
Patient is requesting work note for today since she was too weak to work today after starting daily regimen of furosemide.  Patient is requesting work note be faxed to [employer] at [phone]. Patient also thinks she is weak because of not taking potassium with  Furosemide. Please advise if she should get otc K+ or rx. Uses [facility] [address] or [address].

## 2012-02-17 NOTE — Telephone Encounter (Signed)
We can grant pt a note to be excused from work, since we just started her on daily Lasix and she feels this is the reason for her diminished energy. However, she will need a BMET, before before instructed to take any supplemental K.

## 2012-02-17 NOTE — Telephone Encounter (Signed)
Patient informed. Lab order faxed to [facility] lab and work note faxed to [phone].

## 2012-02-25 ENCOUNTER — Telehealth: Payer: Self-pay | Admitting: *Deleted

## 2012-02-25 NOTE — Telephone Encounter (Signed)
Message left on nurse's voicemail from patient with follow up report on status swelling. BP has remained the same as it was in last office visit around137/90, weight is down 3 pounds and she continues to swell in the evening.

## 2012-03-05 ENCOUNTER — Telehealth: Payer: Self-pay | Admitting: *Deleted

## 2012-03-05 NOTE — Telephone Encounter (Signed)
Continue to take lasix daily

## 2012-03-05 NOTE — Telephone Encounter (Signed)
Notes Recorded by Lesle Chris, LPN on 3/0/8657 at 9:59 AM Husband Mal Amabile) notified on good labs.

## 2012-03-05 NOTE — Telephone Encounter (Signed)
Message copied by Lesle Chris on Thu Mar 05, 2012 10:00 AM ------      Message from: Myrtis Ser, [place] D      Created: Thu Mar 05, 2012  9:50 AM       Lab is good.

## 2012-03-06 NOTE — Telephone Encounter (Signed)
Patient notified and verbalized understanding. 

## 2012-03-25 ENCOUNTER — Ambulatory Visit (INDEPENDENT_AMBULATORY_CARE_PROVIDER_SITE_OTHER): Payer: BC Managed Care – PPO | Admitting: Cardiology

## 2012-03-25 ENCOUNTER — Encounter: Payer: Self-pay | Admitting: Cardiology

## 2012-03-25 ENCOUNTER — Encounter: Payer: Self-pay | Admitting: *Deleted

## 2012-03-25 VITALS — BP 135/94 | HR 81 | Ht 63.0 in | Wt 246.0 lb

## 2012-03-25 DIAGNOSIS — R42 Dizziness and giddiness: Secondary | ICD-10-CM

## 2012-03-25 DIAGNOSIS — I10 Essential (primary) hypertension: Secondary | ICD-10-CM

## 2012-03-25 DIAGNOSIS — E8770 Fluid overload, unspecified: Secondary | ICD-10-CM

## 2012-03-25 DIAGNOSIS — E8779 Other fluid overload: Secondary | ICD-10-CM

## 2012-03-25 NOTE — Assessment & Plan Note (Signed)
Volume status is stable. No change in therapy. 

## 2012-03-25 NOTE — Assessment & Plan Note (Signed)
The patient is doing well with her current hypertensive regimen. No change in therapy.

## 2012-03-25 NOTE — Patient Instructions (Addendum)
Your physician recommends that you schedule a follow-up appointment in: 3 months. Your physician recommends that you continue on your current medications as directed. Please refer to the Current Medication list given to you today. 

## 2012-03-25 NOTE — Assessment & Plan Note (Signed)
She is not currently having any dizziness. No change in therapy. Followup in 3 months.

## 2012-03-25 NOTE — Progress Notes (Signed)
HPI Overall the patient is doing well. It has been a complicated problem treating her hypertension over time. However for now we are doing a good job. She is tolerating Tenex at a low dose. She's tolerating Lasix daily. Her labs are stable. She been having some difficulties with nightmares. This is gone. She has some swelling during the days if she is up but this improves at nighttime. She has started exercising again.  Allergies  Allergen Reactions  . Penicillins   . Sulfonamide Derivatives     Current Outpatient Prescriptions  Medication Sig Dispense Refill  . amLODipine (NORVASC) 10 MG tablet Take 10 mg by mouth daily.        . furosemide (LASIX) 40 MG tablet Take 40 mg by mouth daily.       . furosemide (LASIX) 40 MG tablet TAKE 1 TABLET BY MOUTH EVERY DAY  30 tablet  5  . guanFACINE (TENEX) 1 MG tablet Take 1 tablet (1 mg total) by mouth at bedtime.  30 tablet  6  . hydrocodone-acetaminophen (LORCET-HD) 5-500 MG per capsule Take 1 capsule by mouth daily as needed.      . metoprolol (LOPRESSOR) 50 MG tablet Take 50 mg by mouth daily. STOP ONCE BYSTOLIC IS COMPLETE AND START METOPROLOL SUCCINATE 50 MG DAILY        History   Social History  . Marital Status: Married    Spouse Name: N/A    Number of Children: 2  . Years of Education: N/A   Occupational History  . full time    Social History Main Topics  . Smoking status: Never Smoker   . Smokeless tobacco: Never Used  . Alcohol Use: No  . Drug Use: No  . Sexually Active: Not on file   Other Topics Concern  . Not on file   Social History Narrative  . No narrative on file    Family History  Problem Relation Age of Onset  . Cancer Other     Past Medical History  Diagnosis Date  . Obesity   . Migraine   . Anxiety   . Palpitation   . Ejection fraction < 50%     EF 35-40%...echo...january 27,2011/cardiac cta EF 51%...09/05/2009 LVH..moderate...echo january 2011  . Tricuspid regurgitation     moderate  ...echo..january 2011  . Hypertension     severe,but very responsive to amlodipine and hydrochlorithizide  overweight  . Shortness of breath   . Coronary artery fistula     ??? by echo 08/2009../..cardiac CTA...09/05/2009...no coronary fistula  . UTI (urinary tract infection)     recurrent  . LVH (left ventricular hypertrophy)     Moderate echo, January, 2011  . Pulmonary hypertension     60 mmHg echo January, 2011 / normal pulmonary arteries by CT angiography  . Fluid overload     April, 2012  . Dizziness     Nonspecific, May, 2012, feeling off balance  . Weight gain     August, 2012    Past Surgical History  Procedure Date  . Cesarean section   . Tubal ligation     ROS  Patient denies fever, chills, headache, sweats, rash, change in vision, change in hearing, chest pain, cough, nausea vomiting, urinary symptoms. All other systems are reviewed and are negative.  PHYSICAL EXAM  Patient is overweight but stable. She is oriented to person time and place. Affect is normal. There is no jugulovenous distention. Lungs are clear. Respiratory effort is nonlabored. Cardiac exam reveals  S1 and S2. There no clicks or significant murmurs. The abdomen is soft. She has no significant peripheral edema at this time.  Filed Vitals:   03/25/12 1348  BP: 135/94  Pulse: 81  Height: 5\' 3"  (1.6 m)  Weight: 246 lb (111.585 kg)    EKG  ASSESSMENT & PLAN

## 2012-06-26 ENCOUNTER — Encounter: Payer: Self-pay | Admitting: Cardiology

## 2012-06-26 ENCOUNTER — Ambulatory Visit (INDEPENDENT_AMBULATORY_CARE_PROVIDER_SITE_OTHER): Payer: BC Managed Care – PPO | Admitting: Cardiology

## 2012-06-26 ENCOUNTER — Encounter: Payer: Self-pay | Admitting: *Deleted

## 2012-06-26 VITALS — BP 144/95 | HR 71 | Ht 63.0 in | Wt 245.0 lb

## 2012-06-26 DIAGNOSIS — I10 Essential (primary) hypertension: Secondary | ICD-10-CM

## 2012-06-26 DIAGNOSIS — R002 Palpitations: Secondary | ICD-10-CM

## 2012-06-26 DIAGNOSIS — E8779 Other fluid overload: Secondary | ICD-10-CM

## 2012-06-26 DIAGNOSIS — E8770 Fluid overload, unspecified: Secondary | ICD-10-CM

## 2012-06-26 NOTE — Assessment & Plan Note (Signed)
Things are going very well with her hypertension. I would like to get some further reduction in her diastolic pressure. However she's doing extremely well. I've chosen not to make any changes. When I see her back next we will consider further adjustment. Her current dose of Tenex is very low. We may increase it to 2 mg.

## 2012-06-26 NOTE — Assessment & Plan Note (Signed)
She's not having any significant palpitations. No change in therapy. 

## 2012-06-26 NOTE — Patient Instructions (Addendum)
Your physician recommends that you schedule a follow-up appointment in: 3 months. Your physician recommends that you continue on your current medications as directed. Please refer to the Current Medication list given to you today. 

## 2012-06-26 NOTE — Assessment & Plan Note (Signed)
Fluid status is stable.  No change in therapy. 

## 2012-06-26 NOTE — Progress Notes (Signed)
HPI  The patient is seen for followup of very difficult to control blood pressure. I am really pleased with her current status. Her diastolic runs a little high but otherwise she's doing extremely well. We have a very stable regimen of medicine. She currently has a UTI and she is on antibiotics.  Allergies  Allergen Reactions  . Penicillins   . Sulfonamide Derivatives     Current Outpatient Prescriptions  Medication Sig Dispense Refill  . amLODipine (NORVASC) 10 MG tablet Take 10 mg by mouth daily.        . ciprofloxacin (CIPRO) 500 MG tablet Take 500 mg by mouth 2 (two) times daily.       . furosemide (LASIX) 40 MG tablet TAKE 1 TABLET BY MOUTH EVERY DAY  30 tablet  5  . guanFACINE (TENEX) 1 MG tablet Take 1 tablet (1 mg total) by mouth at bedtime.  30 tablet  6  . hydrocodone-acetaminophen (LORCET-HD) 5-500 MG per capsule Take 1 capsule by mouth daily as needed.      . metoprolol (LOPRESSOR) 50 MG tablet Take 50 mg by mouth daily. STOP ONCE BYSTOLIC IS COMPLETE AND START METOPROLOL SUCCINATE 50 MG DAILY      . omeprazole (PRILOSEC) 20 MG capsule Take 20 mg by mouth as needed.      . [DISCONTINUED] furosemide (LASIX) 40 MG tablet Take 40 mg by mouth daily.         History   Social History  . Marital Status: Married    Spouse Name: N/A    Number of Children: 2  . Years of Education: N/A   Occupational History  . full time    Social History Main Topics  . Smoking status: Never Smoker   . Smokeless tobacco: Never Used  . Alcohol Use: No  . Drug Use: No  . Sexually Active: Not on file   Other Topics Concern  . Not on file   Social History Narrative  . No narrative on file    Family History  Problem Relation Age of Onset  . Cancer Other     Past Medical History  Diagnosis Date  . Obesity   . Migraine   . Anxiety   . Palpitation   . Ejection fraction < 50%     EF 35-40%...echo...january 27,2011/cardiac cta EF 51%...09/05/2009 LVH..moderate...echo january 2011    . Tricuspid regurgitation     moderate ...echo..january 2011  . Hypertension     severe,but very responsive to amlodipine and hydrochlorithizide  overweight  . Shortness of breath   . Coronary artery fistula     ??? by echo 08/2009../..cardiac CTA...09/05/2009...no coronary fistula  . UTI (urinary tract infection)     recurrent  . LVH (left ventricular hypertrophy)     Moderate echo, January, 2011  . Pulmonary hypertension     60 mmHg echo January, 2011 / normal pulmonary arteries by CT angiography  . Fluid overload     April, 2012  . Dizziness     Nonspecific, May, 2012, feeling off balance  . Weight gain     August, 2012    Past Surgical History  Procedure Date  . Cesarean section   . Tubal ligation     Patient Active Problem List  Diagnosis  . Obesity  . Palpitation  . Ejection fraction < 50%  . Tricuspid regurgitation  . Hypertension  . Shortness of breath  . Coronary artery fistula  . UTI (urinary tract infection)  . LVH (left  ventricular hypertrophy)  . Pulmonary hypertension  . Fluid overload  . Dizziness  . Weight gain    ROS  Patient denies fever, chills, headache, sweats, rash, change in vision, change in hearing, chest pain, cough, nausea vomiting, urinary symptoms. All other systems are reviewed and are negative.  PHYSICAL EXAM  Patient is oriented to person time and place. Affect is normal. Lungs are clear. Respiratory effort is nonlabored. Cardiac exam reveals S1 and S2. There no clicks or significant murmurs. The abdomen is soft. There is no peripheral edema.   Filed Vitals:   06/26/12 1042  BP: 144/95  Pulse: 71  Height: 5\' 3"  (1.6 m)  Weight: 245 lb (111.131 kg)   EKG is done today and reviewed by me. There sinus rhythm. There is some decreased anterior R-wave. There is no significant abnormality or change.  ASSESSMENT & PLAN

## 2012-08-07 ENCOUNTER — Other Ambulatory Visit: Payer: Self-pay | Admitting: *Deleted

## 2012-08-07 MED ORDER — GUANFACINE HCL 1 MG PO TABS: 1.0000 mg | ORAL_TABLET | Freq: Every day | ORAL | Status: DC

## 2012-08-31 ENCOUNTER — Other Ambulatory Visit: Payer: Self-pay

## 2012-08-31 MED ORDER — METOPROLOL TARTRATE 50 MG PO TABS: 50.0000 mg | ORAL_TABLET | Freq: Every day | ORAL | Status: DC

## 2012-10-07 ENCOUNTER — Encounter: Payer: Self-pay | Admitting: Cardiology

## 2012-10-07 ENCOUNTER — Ambulatory Visit (INDEPENDENT_AMBULATORY_CARE_PROVIDER_SITE_OTHER): Payer: BC Managed Care – PPO | Admitting: Cardiology

## 2012-10-07 ENCOUNTER — Encounter: Payer: Self-pay | Admitting: *Deleted

## 2012-10-07 VITALS — BP 160/116 | HR 76 | Ht 63.0 in | Wt 248.4 lb

## 2012-10-07 DIAGNOSIS — E8770 Fluid overload, unspecified: Secondary | ICD-10-CM

## 2012-10-07 DIAGNOSIS — E8779 Other fluid overload: Secondary | ICD-10-CM

## 2012-10-07 DIAGNOSIS — I10 Essential (primary) hypertension: Secondary | ICD-10-CM

## 2012-10-07 DIAGNOSIS — E669 Obesity, unspecified: Secondary | ICD-10-CM

## 2012-10-07 MED ORDER — GUANFACINE HCL 2 MG PO TABS: 2.0000 mg | ORAL_TABLET | Freq: Every day | ORAL | Status: DC

## 2012-10-07 MED ORDER — METOPROLOL TARTRATE 50 MG PO TABS: 50.0000 mg | ORAL_TABLET | Freq: Every day | ORAL | Status: DC

## 2012-10-07 NOTE — Assessment & Plan Note (Signed)
The patient's blood pressure is higher today than it has been. I am going to to treat with a higher dose of diuretics for a few days. In addition, I will push the Tenex dose up to 2 mg daily. I had started at a very small dose and had been considering increasing the dose. She will check her blood pressure home and call in the results. I will then see her back in followup.

## 2012-10-07 NOTE — Progress Notes (Signed)
Patient ID: [name], female   DOB: 01/26/1976, 37 y.o.   MRN: [id number]   HPI  Patient is seen today to followup hypertension. In the past her blood pressure was very difficult to control. More recently we've been more successful. However in the past few weeks her pressure has been higher. This was recorded by her primary physician. It remains elevated today. She has gained 3 pounds since her last visit. She says she does have some intermittent pedal edema. She feels somewhat bloated in the abdomen. She's not having PND or orthopnea.  Allergies  Allergen Reactions  . Penicillins   . Sulfonamide Derivatives     Current Outpatient Prescriptions  Medication Sig Dispense Refill  . amLODipine (NORVASC) 10 MG tablet Take 10 mg by mouth daily.        . furosemide (LASIX) 40 MG tablet TAKE 1 TABLET BY MOUTH EVERY DAY  30 tablet  5  . guanFACINE (TENEX) 2 MG tablet Take 1 tablet (2 mg total) by mouth at bedtime.  30 tablet  6  . metoprolol (LOPRESSOR) 50 MG tablet Take 1 tablet (50 mg total) by mouth daily.  30 tablet  6  . omeprazole (PRILOSEC) 20 MG capsule Take 20 mg by mouth as needed.       No current facility-administered medications for this visit.    History   Social History  . Marital Status: Married    Spouse Name: N/A    Number of Children: 2  . Years of Education: N/A   Occupational History  . full time    Social History Main Topics  . Smoking status: Never Smoker   . Smokeless tobacco: Never Used  . Alcohol Use: No  . Drug Use: No  . Sexually Active: Not on file   Other Topics Concern  . Not on file   Social History Narrative  . No narrative on file    Family History  Problem Relation Age of Onset  . Cancer Other     Past Medical History  Diagnosis Date  . Obesity   . Migraine   . Anxiety   . Palpitation   . Ejection fraction < 50%     EF 35-40%...echo...january 27,2011/cardiac cta EF 51%...09/05/2009 LVH..moderate...echo january 2011  . Tricuspid  regurgitation     moderate ...echo..january 2011  . Hypertension     severe,but very responsive to amlodipine and hydrochlorithizide  overweight  . Shortness of breath   . Coronary artery fistula     ??? by echo 08/2009../..cardiac CTA...09/05/2009...no coronary fistula  . UTI (urinary tract infection)     recurrent  . LVH (left ventricular hypertrophy)     Moderate echo, January, 2011  . Pulmonary hypertension     60 mmHg echo January, 2011 / normal pulmonary arteries by CT angiography  . Fluid overload     April, 2012  . Dizziness     Nonspecific, May, 2012, feeling off balance  . Weight gain     August, 2012    Past Surgical History  Procedure Laterality Date  . Cesarean section    . Tubal ligation      Patient Active Problem List  Diagnosis  . Obesity  . Palpitation  . Ejection fraction < 50%  . Tricuspid regurgitation  . Hypertension  . Shortness of breath  . Coronary artery fistula  . UTI (urinary tract infection)  . LVH (left ventricular hypertrophy)  . Pulmonary hypertension  . Fluid overload  . Dizziness  .  Weight gain    ROS   Patient denies fever, chills, headache, sweats, rash, change in vision, change in hearing, chest pain, cough, nausea vomiting, urinary symptoms. She did recently receive antibiotics for sinusitis and then followup medications for yeast infection. This is improved. All other systems are reviewed and are negative.  PHYSICAL EXAM   Patient is overweight. She has gained 3 pounds. She is oriented to person time and place. Affect is normal. There is no jugulovenous distention. Lungs are clear. Respiratory effort is nonlabored. Cardiac exam reveals S1 and S2. There no clicks or significant murmurs. The abdomen is soft. There is no significant peripheral edema today.  Filed Vitals:   10/07/12 1103 10/07/12 1109  BP: 157/111 160/116  Pulse: 76   Height: 5\' 3"  (1.6 m)   Weight: 248 lb 6.4 oz (112.674 kg)   SpO2: 76%      ASSESSMENT &  PLAN

## 2012-10-07 NOTE — Patient Instructions (Signed)
   Increase Tenex to 2mg  daily - new sent to pharm  Increase Lasix to 80mg  daily x 2 days only, then resume previous dose   Check blood pressure at home every day to every other day x 2 weeks and call results to office  Continue all other current medications. Follow up in  8 weeks

## 2012-10-07 NOTE — Assessment & Plan Note (Signed)
The patient has gained 3 pounds since her last visit. It is difficult for me to know if this is true body weight or has a component of fluid.

## 2012-10-07 NOTE — Assessment & Plan Note (Signed)
I think there may be a mild component of fluid overload at this time. She says she is having some intermittent edema. She says that her abdomen also feels full. She'll receive 80 mg daily for 2 days and then return to her 40 mg. She is always reminded to limit her salt intake and her fluid intake.

## 2012-10-08 ENCOUNTER — Telehealth: Payer: Self-pay | Admitting: *Deleted

## 2012-10-08 ENCOUNTER — Encounter: Payer: Self-pay | Admitting: *Deleted

## 2012-10-08 NOTE — Telephone Encounter (Signed)
Patient called requesting additional work excuse for today due to increased amounts of using the bathroom due to lasix increase. Nurse provided letter and faxed to patient's job. Fax #[phone]. Patient aware.

## 2012-11-04 ENCOUNTER — Other Ambulatory Visit: Payer: Self-pay

## 2012-11-04 MED ORDER — METOPROLOL TARTRATE 50 MG PO TABS: 50.0000 mg | ORAL_TABLET | Freq: Every day | ORAL | Status: DC

## 2012-12-04 ENCOUNTER — Ambulatory Visit (INDEPENDENT_AMBULATORY_CARE_PROVIDER_SITE_OTHER): Payer: BC Managed Care – PPO | Admitting: Cardiology

## 2012-12-04 ENCOUNTER — Encounter: Payer: Self-pay | Admitting: Cardiology

## 2012-12-04 ENCOUNTER — Encounter: Payer: Self-pay | Admitting: *Deleted

## 2012-12-04 VITALS — BP 153/98 | HR 80 | Ht 63.0 in | Wt 248.8 lb

## 2012-12-04 DIAGNOSIS — I509 Heart failure, unspecified: Secondary | ICD-10-CM

## 2012-12-04 DIAGNOSIS — I5032 Chronic diastolic (congestive) heart failure: Secondary | ICD-10-CM

## 2012-12-04 DIAGNOSIS — I10 Essential (primary) hypertension: Secondary | ICD-10-CM

## 2012-12-04 MED ORDER — FUROSEMIDE 40 MG PO TABS: ORAL_TABLET | ORAL | Status: DC

## 2012-12-04 NOTE — Assessment & Plan Note (Signed)
The patient continues to have problems with volume overload. It is now time to increase her Lasix 80 mg daily and keep it at this dose. Chemistry lab will be checked in 7-10 days. We'll see her back in 8 weeks.

## 2012-12-04 NOTE — Progress Notes (Signed)
HPI  Patient is seen today in followup severe hypertension. When I saw her last I increased the dose of Tenex to 2 mg. She thinks this may have made her feel a little fatigued but she's doing better now. Also at that time I gave her several days and 80 of Lasix instead of 40. She diuresis significantly by history. However she is regaining fluid. She has edema and some abdominal bloating when her volume is overloaded.  Allergies  Allergen Reactions  . Penicillins   . Sulfonamide Derivatives     Current Outpatient Prescriptions  Medication Sig Dispense Refill  . amLODipine (NORVASC) 10 MG tablet Take 10 mg by mouth daily.        . furosemide (LASIX) 40 MG tablet TAKE 1 TABLET BY MOUTH EVERY DAY  30 tablet  5  . guanFACINE (TENEX) 2 MG tablet Take 1 tablet (2 mg total) by mouth at bedtime.  30 tablet  6  . metoprolol (LOPRESSOR) 50 MG tablet Take 1 tablet (50 mg total) by mouth daily.  30 tablet  0  . omeprazole (PRILOSEC) 20 MG capsule Take 20 mg by mouth as needed.       No current facility-administered medications for this visit.    History   Social History  . Marital Status: Married    Spouse Name: N/A    Number of Children: 2  . Years of Education: N/A   Occupational History  . full time    Social History Main Topics  . Smoking status: Never Smoker   . Smokeless tobacco: Never Used  . Alcohol Use: No  . Drug Use: No  . Sexually Active: Not on file   Other Topics Concern  . Not on file   Social History Narrative  . No narrative on file    Family History  Problem Relation Age of Onset  . Cancer Other     Past Medical History  Diagnosis Date  . Obesity   . Migraine   . Anxiety   . Palpitation   . Ejection fraction < 50%     EF 35-40%...echo...january 27,2011/cardiac cta EF 51%...09/05/2009 LVH..moderate...echo january 2011  . Tricuspid regurgitation     moderate ...echo..january 2011  . Hypertension     severe,but very responsive to amlodipine and  hydrochlorithizide  overweight  . Shortness of breath   . Coronary artery fistula     ??? by echo 08/2009../..cardiac CTA...09/05/2009...no coronary fistula  . UTI (urinary tract infection)     recurrent  . LVH (left ventricular hypertrophy)     Moderate echo, January, 2011  . Pulmonary hypertension     60 mmHg echo January, 2011 / normal pulmonary arteries by CT angiography  . Fluid overload     April, 2012  . Dizziness     Nonspecific, May, 2012, feeling off balance  . Weight gain     August, 2012    Past Surgical History  Procedure Laterality Date  . Cesarean section    . Tubal ligation      Patient Active Problem List   Diagnosis Date Noted  . Weight gain   . Dizziness   . Fluid overload   . Obesity   . Palpitation   . Ejection fraction < 50%   . Tricuspid regurgitation   . Hypertension   . Shortness of breath   . Coronary artery fistula   . UTI (urinary tract infection)   . LVH (left ventricular hypertrophy)   . Pulmonary hypertension  ROS   Patient denies fever, chills, headache, sweats, rash, change in vision, change in hearing, chest pain, cough, nausea vomiting, urinary symptoms. All other systems are reviewed and are negative.  PHYSICAL EXAM   Patient is overweight. Her weight is the same as the last visit. She is oriented to person time and place. Affect is normal. There is no jugulovenous distention. Lungs are clear. Respiratory effort is nonlabored. Cardiac exam reveals S1 and S2. There no clicks or significant murmurs. The abdomen is soft. There is peripheral edema.  Filed Vitals:   12/04/12 1055  BP: 153/98  Pulse: 80  Height: 5\' 3"  (1.6 m)  Weight: 248 lb 12.8 oz (112.855 kg)  SpO2: 99%     ASSESSMENT & PLAN

## 2012-12-04 NOTE — Assessment & Plan Note (Signed)
On a higher dose of Tenex I think her blood pressure is under a little better control. With some additional diuresis her pressure may improve even further. No other changes at this point other than the increased diuretic.

## 2012-12-04 NOTE — Patient Instructions (Addendum)
Your physician recommends that you schedule a follow-up appointment in: 8 weeks. Your physician has recommended you make the following change in your medication: increased your furosemide to 80 mg daily. Please take (2) of your 40 mg tablets daily. Your new prescription has been sent to your pharmacy. All other medications will remain the same. Your physician recommends that you return for lab work in 7-10 days for BMET. This is to be done at [facility] around 12/11/12.

## 2013-03-11 ENCOUNTER — Other Ambulatory Visit: Payer: Self-pay | Admitting: *Deleted

## 2013-03-12 ENCOUNTER — Ambulatory Visit (INDEPENDENT_AMBULATORY_CARE_PROVIDER_SITE_OTHER): Payer: BC Managed Care – PPO | Admitting: Cardiology

## 2013-03-12 ENCOUNTER — Encounter: Payer: Self-pay | Admitting: Cardiology

## 2013-03-12 ENCOUNTER — Encounter: Payer: Self-pay | Admitting: *Deleted

## 2013-03-12 VITALS — BP 138/92 | HR 69 | Ht 63.0 in | Wt 243.0 lb

## 2013-03-12 DIAGNOSIS — I509 Heart failure, unspecified: Secondary | ICD-10-CM

## 2013-03-12 DIAGNOSIS — K5900 Constipation, unspecified: Secondary | ICD-10-CM

## 2013-03-12 DIAGNOSIS — I10 Essential (primary) hypertension: Secondary | ICD-10-CM

## 2013-03-12 DIAGNOSIS — I5032 Chronic diastolic (congestive) heart failure: Secondary | ICD-10-CM

## 2013-03-12 NOTE — Assessment & Plan Note (Signed)
This is a new problem. It may be associated with the increased dose of Tenex. For now we will try to use medications to help her with constipation. I am hopeful that we will not have to change her blood pressure medicines. She's doing extremely well on her current regimen.

## 2013-03-12 NOTE — Patient Instructions (Addendum)
Your physician recommends that you schedule a follow-up appointment in: 3 months.  Your physician recommends that you continue on your current medications as directed. Please refer to the Current Medication list given to you today. Please call our office with the name of the medication your family doctor gave you.

## 2013-03-12 NOTE — Progress Notes (Signed)
HPI  Patient is seen today to followup hypertension and associated cardiac issues. Her blood pressure is under reasonable control. She's developed some constipation. This temporally is related to increasing her  Tenex to 2 mg. This medicine can be associated with constipation. She will begin taking a stool softener or Metamucil.  She's had some mild headaches. This does not appear to be a significant problem. 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 I told her that she can be cleared for surgery if this is needed.   I had increased her diuretic dose at the last visit. Her weight is down 5 pounds. She is feeling better. She tells me that she had lab work done at her primary care office and that her kidney function was good.  Allergies  Allergen Reactions  . Penicillins   . Sulfonamide Derivatives     Current Outpatient Prescriptions  Medication Sig Dispense Refill  . amLODipine (NORVASC) 10 MG tablet Take 10 mg by mouth daily.        . furosemide (LASIX) 40 MG tablet TAKE 2 TABLET BY MOUTH EVERY DAY  60 tablet  5  . guanFACINE (TENEX) 2 MG tablet Take 1 tablet (2 mg total) by mouth at bedtime.  30 tablet  6  . metoprolol (LOPRESSOR) 50 MG tablet Take 1 tablet (50 mg total) by mouth daily.  30 tablet  0  . omeprazole (PRILOSEC) 20 MG capsule Take 20 mg by mouth as needed.       No current facility-administered medications for this visit.    History   Social History  . Marital Status: Married    Spouse Name: N/A    Number of Children: 2  . Years of Education: N/A   Occupational History  . full time    Social History Main Topics  . Smoking status: Never Smoker   . Smokeless tobacco: Never Used  . Alcohol Use: No  . Drug Use: No  . Sexually Active: Not on file   Other Topics Concern  . Not on file   Social History Narrative  . No narrative on file    Family History  Problem Relation Age of Onset  . Cancer Other     Past Medical History  Diagnosis Date  . Obesity   .  Migraine   . Anxiety   . Palpitation   . Ejection fraction < 50%     EF 35-40%...echo...january 27,2011/cardiac cta EF 51%...09/05/2009 LVH..moderate...echo january 2011  . Tricuspid regurgitation     moderate ...echo..january 2011  . Hypertension     severe,but very responsive to amlodipine and hydrochlorithizide  overweight  . Shortness of breath   . Coronary artery fistula     ??? by echo 08/2009../..cardiac CTA...09/05/2009...no coronary fistula  . UTI (urinary tract infection)     recurrent  . LVH (left ventricular hypertrophy)     Moderate echo, January, 2011  . Pulmonary hypertension     60 mmHg echo January, 2011 / normal pulmonary arteries by CT angiography  . Fluid overload     April, 2012  . Dizziness     Nonspecific, May, 2012, feeling off balance  . Weight gain     August, 2012  . Chronic diastolic CHF (congestive heart failure)   . Constipation     Past Surgical History  Procedure Laterality Date  . Cesarean section    . Tubal ligation      Patient Active Problem List   Diagnosis Date Noted  .  Constipation   . Chronic diastolic CHF (congestive heart failure)   . Weight gain   . Dizziness   . Fluid overload   . Obesity   . Palpitation   . Ejection fraction < 50%   . Tricuspid regurgitation   . Hypertension   . Shortness of breath   . Coronary artery fistula   . UTI (urinary tract infection)   . LVH (left ventricular hypertrophy)   . Pulmonary hypertension     ROS   Patient denies fever, chills, headache, sweats, rash, change in vision, change in hearing, chest pain, cough, nausea vomiting, urinary symptoms. All other systems are reviewed and are negative.  PHYSICAL EXAM   Patient is overweight but stable. She's oriented to person time and place. Affect is normal. Lungs are clear. Respiratory effort is nonlabored. Cardiac exam reveals S1 and S2. There no clicks or significant murmurs. Abdomen is soft. There is no significant peripheral edema.  Filed  Vitals:   03/12/13 1032  BP: 138/92  Pulse: 69  Height: 5\' 3"  (1.6 m)  Weight: 243 lb (110.224 kg)     ASSESSMENT & PLAN

## 2013-03-12 NOTE — Assessment & Plan Note (Signed)
She has good blood pressure control for her. No change in therapy today.

## 2013-03-12 NOTE — Assessment & Plan Note (Signed)
This appears to be under good control at this time. We will continue the same dose of her medicines.

## 2013-03-22 ENCOUNTER — Telehealth: Payer: Self-pay | Admitting: *Deleted

## 2013-03-22 ENCOUNTER — Other Ambulatory Visit: Payer: Self-pay | Admitting: *Deleted

## 2013-03-22 NOTE — Telephone Encounter (Signed)
Patient called office to give the name of the constipation medication given to her by her PCP which is called Linzess 145 mcg.

## 2013-03-23 NOTE — Telephone Encounter (Signed)
Please tell her I do not know anything about this medicine. But I think it is best to try it as her primary MD has suggested it to her. (she has prescription). Let us know how it works.

## 2013-03-23 NOTE — Telephone Encounter (Signed)
Patient informed via voicemail.

## 2013-06-07 ENCOUNTER — Other Ambulatory Visit: Payer: Self-pay | Admitting: Cardiology

## 2013-06-21 ENCOUNTER — Encounter: Payer: Self-pay | Admitting: Cardiology

## 2013-06-21 ENCOUNTER — Ambulatory Visit (INDEPENDENT_AMBULATORY_CARE_PROVIDER_SITE_OTHER): Payer: BC Managed Care – PPO | Admitting: Cardiology

## 2013-06-21 ENCOUNTER — Encounter: Payer: Self-pay | Admitting: *Deleted

## 2013-06-21 VITALS — BP 141/88 | HR 64 | Ht 63.0 in | Wt 241.0 lb

## 2013-06-21 DIAGNOSIS — R0989 Other specified symptoms and signs involving the circulatory and respiratory systems: Secondary | ICD-10-CM

## 2013-06-21 DIAGNOSIS — I509 Heart failure, unspecified: Secondary | ICD-10-CM

## 2013-06-21 DIAGNOSIS — R300 Dysuria: Secondary | ICD-10-CM

## 2013-06-21 DIAGNOSIS — R002 Palpitations: Secondary | ICD-10-CM

## 2013-06-21 DIAGNOSIS — I5032 Chronic diastolic (congestive) heart failure: Secondary | ICD-10-CM

## 2013-06-21 DIAGNOSIS — I10 Essential (primary) hypertension: Secondary | ICD-10-CM

## 2013-06-21 DIAGNOSIS — R9430 Abnormal result of cardiovascular function study, unspecified: Secondary | ICD-10-CM

## 2013-06-21 MED ORDER — FUROSEMIDE 40 MG PO TABS: 80.0000 mg | ORAL_TABLET | Freq: Every day | ORAL | Status: DC | PRN

## 2013-06-21 MED ORDER — GUANFACINE HCL 2 MG PO TABS: 2.0000 mg | ORAL_TABLET | Freq: Every day | ORAL | Status: DC

## 2013-06-21 MED ORDER — AMLODIPINE BESYLATE 10 MG PO TABS: 10.0000 mg | ORAL_TABLET | Freq: Every day | ORAL | Status: DC

## 2013-06-21 MED ORDER — METOPROLOL TARTRATE 50 MG PO TABS: 50.0000 mg | ORAL_TABLET | Freq: Every day | ORAL | Status: DC

## 2013-06-21 NOTE — Assessment & Plan Note (Signed)
In 2011 the patient had decreased left ventricular function. She had an extensive workup. It was finally felt that hypertensive disease was the most likely culprit. She has had return of good function. She does not need a followup echo now. Good control of her blood pressure is very important.

## 2013-06-21 NOTE — Assessment & Plan Note (Signed)
At this time the patient's volume status is stable. When she gave urine samples at her primary care office, there was question of dehydration. It is my understanding this was based on concentration of her urine. She walks a fine line between being volume depleted and volume overloaded. Her renal function and physical exam suggest that she's relatively euvolemic. I am hesitant to encourage fluids as she we'll definitely develop edema.

## 2013-06-21 NOTE — Assessment & Plan Note (Signed)
I've asked the patient to followup again with her primary care team. I do not think that any of her urinary symptoms are related to her cardiac status.

## 2013-06-21 NOTE — Patient Instructions (Signed)
Your physician recommends that you schedule a follow-up appointment in: 3 months. Your physician recommends that you continue on your current medications as directed. Please refer to the Current Medication list given to you today. 

## 2013-06-21 NOTE — Assessment & Plan Note (Signed)
Historically we have had a very difficult time controlling her blood pressure. More recently she is doing much better. No change in therapy.

## 2013-06-21 NOTE — Progress Notes (Signed)
HPI  Patient is seen today to followup hypertension and chronic diastolic CHF. Her blood pressure and volume status had been relatively stable. She's been having some burning in her urine. She has been evaluated nicely by her primary care team. She has received antibiotics and she is feeling better. She mentions however that she still has slight intermittent burning. She will followup with her primary care team. During her recent evaluations she has been told that she is dehydrated. I believe that this was on the basis of her urine being concentrated. A chemistry study was done. It showed that her BUN was 10 and creatinine 1.13 area this compares to a prior BUN of 17 and creatinine 0.96. I doubt there was any significant change. She's not having any significant shortness of breath.  Allergies  Allergen Reactions  . Penicillins   . Sulfonamide Derivatives     Current Outpatient Prescriptions  Medication Sig Dispense Refill  . amLODipine (NORVASC) 10 MG tablet Take 1 tablet (10 mg total) by mouth daily.  30 tablet  6  . furosemide (LASIX) 40 MG tablet Take 2 tablets (80 mg total) by mouth daily as needed. TAKE 2 TABLET BY MOUTH AS NEEDED  30 tablet  3  . guanFACINE (TENEX) 2 MG tablet Take 1 tablet (2 mg total) by mouth at bedtime.  30 tablet  6  . ibuprofen (ADVIL,MOTRIN) 400 MG tablet Take 400 mg by mouth every 8 (eight) hours as needed.       . metoprolol (LOPRESSOR) 50 MG tablet Take 1 tablet (50 mg total) by mouth daily.  30 tablet  6  . omeprazole (PRILOSEC) 20 MG capsule Take 20 mg by mouth as needed.       No current facility-administered medications for this visit.    History   Social History  . Marital Status: Married    Spouse Name: N/A    Number of Children: 2  . Years of Education: N/A   Occupational History  . full time    Social History Main Topics  . Smoking status: Never Smoker   . Smokeless tobacco: Never Used  . Alcohol Use: No  . Drug Use: No  . Sexual  Activity: Not on file   Other Topics Concern  . Not on file   Social History Narrative  . No narrative on file    Family History  Problem Relation Age of Onset  . Cancer Other     Past Medical History  Diagnosis Date  . Obesity   . Migraine   . Anxiety   . Palpitation   . Ejection fraction < 50%     EF 35-40%...echo...january 27,2011/cardiac cta EF 51%...09/05/2009 LVH..moderate...echo january 2011  . Tricuspid regurgitation     moderate ...echo..january 2011  . Hypertension     severe,but very responsive to amlodipine and hydrochlorithizide  overweight  . Shortness of breath   . Coronary artery fistula     ??? by echo 08/2009../..cardiac CTA...09/05/2009...no coronary fistula  . UTI (urinary tract infection)     recurrent  . LVH (left ventricular hypertrophy)     Moderate echo, January, 2011  . Pulmonary hypertension     60 mmHg echo January, 2011 / normal pulmonary arteries by CT angiography  . Fluid overload     April, 2012  . Dizziness     Nonspecific, May, 2012, feeling off balance  . Weight gain     August, 2012  . Chronic diastolic CHF (congestive heart failure)   .  Constipation     Past Surgical History  Procedure Laterality Date  . Cesarean section    . Tubal ligation      Patient Active Problem List   Diagnosis Date Noted  . Constipation   . Chronic diastolic CHF (congestive heart failure)   . Weight gain   . Dizziness   . Fluid overload   . Obesity   . Palpitation   . Ejection fraction < 50%   . Tricuspid regurgitation   . Hypertension   . Shortness of breath   . Coronary artery fistula   . UTI (urinary tract infection)   . LVH (left ventricular hypertrophy)   . Pulmonary hypertension     ROS   Patient denies fever, chills, headache, sweats, rash, change in vision, change in hearing, chest pain, cough, nausea vomiting, .  All other systems are reviewed and are negative other than the history of present illness.  PHYSICAL EXAM  Patient is  overweight. She is oriented to person time and place. Affect is normal. There is no jugulovenous distention. Lungs are clear. Respiratory effort is nonlabored. Cardiac exam reveals S1 and S2. There no clicks or significant murmurs. The abdomen is soft. There is no peripheral edema today.  Filed Vitals:   06/21/13 1306  BP: 141/88  Pulse: 64  Height: 5\' 3"  (1.6 m)  Weight: 241 lb (109.317 kg)   EKG is done today and reviewed by me. There sinus rhythm. There are no significant abnormalities.  ASSESSMENT & PLAN

## 2013-09-17 ENCOUNTER — Ambulatory Visit (INDEPENDENT_AMBULATORY_CARE_PROVIDER_SITE_OTHER): Payer: BC Managed Care – PPO | Admitting: Cardiology

## 2013-09-17 ENCOUNTER — Encounter: Payer: Self-pay | Admitting: Cardiology

## 2013-09-17 ENCOUNTER — Encounter: Payer: Self-pay | Admitting: *Deleted

## 2013-09-17 VITALS — BP 148/96 | HR 77 | Ht 63.0 in | Wt 240.0 lb

## 2013-09-17 DIAGNOSIS — R0989 Other specified symptoms and signs involving the circulatory and respiratory systems: Secondary | ICD-10-CM

## 2013-09-17 DIAGNOSIS — I5032 Chronic diastolic (congestive) heart failure: Secondary | ICD-10-CM

## 2013-09-17 DIAGNOSIS — I10 Essential (primary) hypertension: Secondary | ICD-10-CM

## 2013-09-17 DIAGNOSIS — I509 Heart failure, unspecified: Secondary | ICD-10-CM

## 2013-09-17 DIAGNOSIS — R9430 Abnormal result of cardiovascular function study, unspecified: Secondary | ICD-10-CM

## 2013-09-17 NOTE — Assessment & Plan Note (Signed)
Her blood pressure is under very reasonable control for her. No change in therapy.

## 2013-09-17 NOTE — Assessment & Plan Note (Signed)
Her volume status is stable on her current therapy. No change in therapy.

## 2013-09-17 NOTE — Assessment & Plan Note (Signed)
Ejection fraction normalized in January, 2013. Also evidence of pulmonary hypertension had improved. This was all related to better control of her blood pressure. She will need a followup echo over the next 6 months. I will see her back in 3 months. Historically the patient has done best when I have seen her on a three-month basis.

## 2013-09-17 NOTE — Patient Instructions (Signed)
Your physician recommends that you schedule a follow-up appointment in: 3 months. Your physician recommends that you continue on your current medications as directed. Please refer to the Current Medication list given to you today. 

## 2013-09-17 NOTE — Progress Notes (Signed)
HPI  Patient is seen today for followup hypertension and chronic diastolic CHF. Historically her blood pressure has been very difficult to control. More recently we have found a good regimen that she tolerates. Her diastolic runs a little bit higher than I would like. However making any further changes at this time would not be helpful. She had left ventricular dysfunction in the past. Fortunately this improved. Her last echo was 2 years ago.  Allergies  Allergen Reactions  . Penicillins   . Sulfonamide Derivatives     Current Outpatient Prescriptions  Medication Sig Dispense Refill  . amLODipine (NORVASC) 10 MG tablet Take 1 tablet (10 mg total) by mouth daily.  30 tablet  6  . furosemide (LASIX) 40 MG tablet Take 2 tablets (80 mg total) by mouth daily as needed. TAKE 2 TABLET BY MOUTH AS NEEDED  30 tablet  3  . guanFACINE (TENEX) 2 MG tablet Take 1 tablet (2 mg total) by mouth at bedtime.  30 tablet  6  . ibuprofen (ADVIL,MOTRIN) 400 MG tablet Take 400 mg by mouth every 8 (eight) hours as needed.       . metoprolol (LOPRESSOR) 50 MG tablet Take 1 tablet (50 mg total) by mouth daily.  30 tablet  6  . omeprazole (PRILOSEC) 20 MG capsule Take 20 mg by mouth as needed.       No current facility-administered medications for this visit.    History   Social History  . Marital Status: Married    Spouse Name: N/A    Number of Children: 2  . Years of Education: N/A   Occupational History  . full time    Social History Main Topics  . Smoking status: Never Smoker   . Smokeless tobacco: Never Used  . Alcohol Use: No  . Drug Use: No  . Sexual Activity: Not on file   Other Topics Concern  . Not on file   Social History Narrative  . No narrative on file    Family History  Problem Relation Age of Onset  . Cancer Other     Past Medical History  Diagnosis Date  . Obesity   . Migraine   . Anxiety   . Palpitation   . Ejection fraction < 50%     EF 35-40%...echo...january  27,2011/cardiac cta EF 51%...09/05/2009 LVH..moderate...echo january 2011  . Tricuspid regurgitation     moderate ...echo..january 2011  . Hypertension     severe,but very responsive to amlodipine and hydrochlorithizide  overweight  . Shortness of breath   . Coronary artery fistula     ??? by echo 08/2009../..cardiac CTA...09/05/2009...no coronary fistula  . UTI (urinary tract infection)     recurrent  . LVH (left ventricular hypertrophy)     Moderate echo, January, 2011  . Pulmonary hypertension     60 mmHg echo January, 2011 / normal pulmonary arteries by CT angiography  . Fluid overload     April, 2012  . Dizziness     Nonspecific, May, 2012, feeling off balance  . Weight gain     August, 2012  . Chronic diastolic CHF (congestive heart failure)   . Constipation   . Burning with urination     Past Surgical History  Procedure Laterality Date  . Cesarean section    . Tubal ligation      Patient Active Problem List   Diagnosis Date Noted  . Burning with urination   . Constipation   . Chronic diastolic CHF (  congestive heart failure)   . Weight gain   . Dizziness   . Fluid overload   . Obesity   . Palpitation   . Ejection fraction < 50%   . Tricuspid regurgitation   . Hypertension   . Shortness of breath   . Coronary artery fistula   . UTI (urinary tract infection)   . LVH (left ventricular hypertrophy)   . Pulmonary hypertension     ROS   Patient denies fever, chills, headache, sweats, rash, change in vision, change in hearing, chest pain, cough, nausea vomiting, urinary symptoms. All other systems are reviewed and are negative.  PHYSICAL EXAM  Patient is overweight but stable. She is oriented to person time and place. Affect is normal. There is no jugulovenous distention. Lungs are clear. Respiratory effort is nonlabored. Cardiac exam reveals S1 and S2. There no clicks or significant murmurs. The abdomen is soft. There is no peripheral edema.  Filed Vitals:    09/17/13 0846  BP: 148/96  Pulse: 77  Height: 5\' 3"  (1.6 m)  Weight: 240 lb (108.863 kg)     ASSESSMENT & PLAN

## 2014-01-07 ENCOUNTER — Encounter: Payer: Self-pay | Admitting: *Deleted

## 2014-01-07 ENCOUNTER — Ambulatory Visit (INDEPENDENT_AMBULATORY_CARE_PROVIDER_SITE_OTHER): Payer: BC Managed Care – PPO | Admitting: Cardiology

## 2014-01-07 ENCOUNTER — Encounter: Payer: Self-pay | Admitting: Cardiology

## 2014-01-07 VITALS — BP 137/96 | HR 67 | Ht 63.0 in | Wt 237.8 lb

## 2014-01-07 DIAGNOSIS — I509 Heart failure, unspecified: Secondary | ICD-10-CM

## 2014-01-07 DIAGNOSIS — I5032 Chronic diastolic (congestive) heart failure: Secondary | ICD-10-CM

## 2014-01-07 DIAGNOSIS — R9430 Abnormal result of cardiovascular function study, unspecified: Secondary | ICD-10-CM

## 2014-01-07 DIAGNOSIS — R0989 Other specified symptoms and signs involving the circulatory and respiratory systems: Secondary | ICD-10-CM

## 2014-01-07 DIAGNOSIS — I10 Essential (primary) hypertension: Secondary | ICD-10-CM

## 2014-01-07 DIAGNOSIS — M542 Cervicalgia: Secondary | ICD-10-CM

## 2014-01-07 MED ORDER — AMLODIPINE BESYLATE 10 MG PO TABS: 10.0000 mg | ORAL_TABLET | Freq: Every day | ORAL | Status: DC

## 2014-01-07 MED ORDER — GUANFACINE HCL 2 MG PO TABS: 2.0000 mg | ORAL_TABLET | Freq: Every day | ORAL | Status: DC

## 2014-01-07 MED ORDER — METOPROLOL SUCCINATE ER 50 MG PO TB24
50.0000 mg | ORAL_TABLET | Freq: Every day | ORAL | Status: DC
Start: 2014-01-07 — End: 2014-09-02

## 2014-01-07 NOTE — Progress Notes (Signed)
Patient ID: Ana SchleinMelissa Wilinski, female   DOB: 12-16-1975, 38 y.o.   MRN: [id number]    HPI  Patient is seen today to followup hypertensive cardiovascular disease and a history in the past of chronic diastolic CHF. She is doing very well. She's lost 3 pounds. Her blood pressure is under control. Historically we've had significant problems with blood pressure control. She is doing very well on her current regimen. She is having some discomfort in her neck and her upper shoulders that occurs during work in the middle of the day. It sounds musculoskeletal at this point.  Allergies  Allergen Reactions  . Penicillins   . Sulfonamide Derivatives     Current Outpatient Prescriptions  Medication Sig Dispense Refill  . amLODipine (NORVASC) 10 MG tablet Take 1 tablet (10 mg total) by mouth daily.  30 tablet  6  . furosemide (LASIX) 40 MG tablet Take 2 tablets (80 mg total) by mouth daily as needed. TAKE 2 TABLET BY MOUTH AS NEEDED  30 tablet  3  . guanFACINE (TENEX) 2 MG tablet Take 1 tablet (2 mg total) by mouth at bedtime.  30 tablet  6  . ibuprofen (ADVIL,MOTRIN) 400 MG tablet Take 400 mg by mouth every 8 (eight) hours as needed.       . metoprolol succinate (TOPROL-XL) 50 MG 24 hr tablet daily.      Marland Kitchen. omeprazole (PRILOSEC) 20 MG capsule Take 20 mg by mouth as needed.       No current facility-administered medications for this visit.    History   Social History  . Marital Status: Married    Spouse Name: N/A    Number of Children: 2  . Years of Education: N/A   Occupational History  . full time    Social History Main Topics  . Smoking status: Never Smoker   . Smokeless tobacco: Never Used  . Alcohol Use: No  . Drug Use: No  . Sexual Activity: Not on file   Other Topics Concern  . Not on file   Social History Narrative  . No narrative on file    Family History  Problem Relation Age of Onset  . Cancer Other     Past Medical History  Diagnosis Date  . Obesity   . Migraine   .  Anxiety   . Palpitation   . Ejection fraction < 50%     EF 35-40%...echo...january 27,2011/cardiac cta EF 51%...09/05/2009 LVH..moderate...echo january 2011  . Tricuspid regurgitation     moderate ...echo..january 2011  . Hypertension     severe,but very responsive to amlodipine and hydrochlorithizide  overweight  . Shortness of breath   . Coronary artery fistula     ??? by echo 08/2009../..cardiac CTA...09/05/2009...no coronary fistula  . UTI (urinary tract infection)     recurrent  . LVH (left ventricular hypertrophy)     Moderate echo, January, 2011  . Pulmonary hypertension     60 mmHg echo January, 2011 / normal pulmonary arteries by CT angiography  . Fluid overload     April, 2012  . Dizziness     Nonspecific, May, 2012, feeling off balance  . Weight gain     August, 2012  . Chronic diastolic CHF (congestive heart failure)   . Constipation   . Burning with urination     Past Surgical History  Procedure Laterality Date  . Cesarean section    . Tubal ligation      Patient Active Problem List   Diagnosis  Date Noted  . Burning with urination   . Constipation   . Chronic diastolic CHF (congestive heart failure)   . Weight gain   . Dizziness   . Fluid overload   . Obesity   . Palpitation   . Ejection fraction < 50%   . Tricuspid regurgitation   . Hypertension   . Shortness of breath   . Coronary artery fistula   . UTI (urinary tract infection)   . LVH (left ventricular hypertrophy)   . Pulmonary hypertension     ROS   Patient denies fever, chills, headache, sweats, rash, change in vision, change in hearing, chest pain, cough, nausea vomiting, urinary symptoms. All other systems are reviewed and are negative.  PHYSICAL EXAM  Patient is overweight but has lost 3 pounds. She is oriented to person time and place. Affect is normal. Head is atraumatic. Sclera and conjunctiva are normal. There is no jugulovenous distention. Lungs are clear. Respiratory effort is not  labored. Cardiac exam reveals S1 and S2. The abdomen is soft. There is no peripheral edema. There no musculoskeletal deformities. There are no skin rashes.  Filed Vitals:   01/07/14 0843  BP: 137/96  Pulse: 67  Height: 5\' 3"  (1.6 m)  Weight: 237 lb 12.8 oz (107.865 kg)   EKG is done today and reviewed by me. There is normal sinus rhythm. There is mild increased voltage.  ASSESSMENT & PLAN

## 2014-01-07 NOTE — Assessment & Plan Note (Signed)
At this point I think that her neck and upper back discomfort is musculoskeletal. I doubt that it is cardiac. I encourage her to see her primary doctor. She may want to consider wearing a soft collar for a period of time to position her neck better at work

## 2014-01-07 NOTE — Assessment & Plan Note (Signed)
Her status is quite stable. No change in therapy. We have been able to stabilize this patient by seeing her frequently. I will see her back again in 3 months.

## 2014-01-07 NOTE — Assessment & Plan Note (Signed)
Historically the patient's ejection fraction had been decreased and then improved. It has been my impression this is been related to treatment of her blood pressure. It is important to do a followup echo. It has been over 2 years.

## 2014-01-07 NOTE — Assessment & Plan Note (Signed)
Fortunately we've had a period of excellent blood pressure control. No change in medicines.

## 2014-01-07 NOTE — Patient Instructions (Signed)
Your physician has requested that you have an echocardiogram. Echocardiography is a painless test that uses sound waves to create images of your heart. It provides your doctor with information about the size and shape of your heart and how well your heart's chambers and valves are working. This procedure takes approximately one hour. There are no restrictions for this procedure. Office will contact with results via phone or letter.   Continue all current medications. Follow up in  3 months   

## 2014-02-22 ENCOUNTER — Telehealth: Payer: Self-pay | Admitting: *Deleted

## 2014-03-22 NOTE — Telephone Encounter (Signed)
Appointment scheduled for echo.

## 2014-03-23 ENCOUNTER — Other Ambulatory Visit: Payer: Self-pay

## 2014-03-23 ENCOUNTER — Encounter: Payer: Self-pay | Admitting: Cardiology

## 2014-03-23 ENCOUNTER — Other Ambulatory Visit (INDEPENDENT_AMBULATORY_CARE_PROVIDER_SITE_OTHER): Payer: BC Managed Care – PPO

## 2014-03-23 DIAGNOSIS — R9430 Abnormal result of cardiovascular function study, unspecified: Secondary | ICD-10-CM

## 2014-03-23 DIAGNOSIS — I509 Heart failure, unspecified: Secondary | ICD-10-CM

## 2014-03-23 DIAGNOSIS — I10 Essential (primary) hypertension: Secondary | ICD-10-CM

## 2014-03-23 DIAGNOSIS — R0989 Other specified symptoms and signs involving the circulatory and respiratory systems: Secondary | ICD-10-CM

## 2014-03-23 DIAGNOSIS — I5032 Chronic diastolic (congestive) heart failure: Secondary | ICD-10-CM

## 2014-03-24 ENCOUNTER — Encounter: Payer: Self-pay | Admitting: Cardiology

## 2014-03-25 ENCOUNTER — Telehealth: Payer: Self-pay | Admitting: *Deleted

## 2014-03-25 NOTE — Telephone Encounter (Signed)
Pt is at work. Relayed info to patients husband.

## 2014-03-25 NOTE — Telephone Encounter (Signed)
Message copied by Jerrye BeaversJONES, Elvie Palomo on Fri Mar 25, 2014  9:48 AM ------      Message from: Myrtis SerKATZ, [place] D      Created: Thu Mar 24, 2014  5:11 PM       Please let her know that her echo looks great. I am very pleased with the result. ------

## 2014-04-12 ENCOUNTER — Encounter: Payer: Self-pay | Admitting: Cardiology

## 2014-04-15 ENCOUNTER — Ambulatory Visit (INDEPENDENT_AMBULATORY_CARE_PROVIDER_SITE_OTHER): Payer: BC Managed Care – PPO | Admitting: Cardiology

## 2014-04-15 ENCOUNTER — Encounter: Payer: Self-pay | Admitting: Cardiology

## 2014-04-15 VITALS — BP 143/93 | HR 75 | Ht 63.0 in | Wt 235.0 lb

## 2014-04-15 DIAGNOSIS — I5032 Chronic diastolic (congestive) heart failure: Secondary | ICD-10-CM

## 2014-04-15 DIAGNOSIS — I10 Essential (primary) hypertension: Secondary | ICD-10-CM

## 2014-04-15 DIAGNOSIS — I509 Heart failure, unspecified: Secondary | ICD-10-CM

## 2014-04-15 NOTE — Assessment & Plan Note (Signed)
She is under excellent control at this time. No change in therapy.

## 2014-04-15 NOTE — Progress Notes (Signed)
Patient ID: [name], female   DOB: [birth year], 38 y.o.   MRN: [id number]    HPI  Patient is seen back today to follow her hypertensive cardiovascular disease. She is doing remarkably well. After seeing her last time we arranged for a followup 2-D echo. She how has excellent systolic function. Her pulmonary pressures have normalized. His very clear that with careful attention to her blood pressure over several years, she has improved dramatically. She's also lost a few pounds since she was here last. She's feeling well.  Allergies  Allergen Reactions  . Penicillins   . Sulfonamide Derivatives     Current Outpatient Prescriptions  Medication Sig Dispense Refill  . amLODipine (NORVASC) 10 MG tablet Take 1 tablet (10 mg total) by mouth daily.  30 tablet  6  . furosemide (LASIX) 40 MG tablet Take 2 tablets (80 mg total) by mouth daily as needed. TAKE 2 TABLET BY MOUTH AS NEEDED  30 tablet  3  . guanFACINE (TENEX) 2 MG tablet Take 1 tablet (2 mg total) by mouth at bedtime.  30 tablet  6  . ibuprofen (ADVIL,MOTRIN) 400 MG tablet Take 400 mg by mouth every 8 (eight) hours as needed.       . metoprolol succinate (TOPROL-XL) 50 MG 24 hr tablet Take 1 tablet (50 mg total) by mouth daily.  30 tablet  6  . omeprazole (PRILOSEC) 20 MG capsule Take 20 mg by mouth as needed.       No current facility-administered medications for this visit.    History   Social History  . Marital Status: Married    Spouse Name: N/A    Number of Children: 2  . Years of Education: N/A   Occupational History  . full time    Social History Main Topics  . Smoking status: Never Smoker   . Smokeless tobacco: Never Used  . Alcohol Use: No  . Drug Use: No  . Sexual Activity: Not on file   Other Topics Concern  . Not on file   Social History Narrative  . No narrative on file    Family History  Problem Relation Age of Onset  . Cancer Other     Past Medical History  Diagnosis Date  . Obesity   .  Migraine   . Anxiety   . Palpitation   . Ejection fraction < 50%     EF 35-40%...echo...january 27,2011/cardiac cta EF 51%...09/05/2009 LVH..moderate...echo january 2011  . Tricuspid regurgitation     moderate ...echo..january 2011  . Hypertension     severe,but very responsive to amlodipine and hydrochlorithizide  overweight  . Shortness of breath   . Coronary artery fistula     ??? by echo 08/2009../..cardiac CTA...09/05/2009...no coronary fistula  . UTI (urinary tract infection)     recurrent  . LVH (left ventricular hypertrophy)     Moderate echo, January, 2011  . Pulmonary hypertension     60 mmHg echo January, 2011 / normal pulmonary arteries by CT angiography  . Fluid overload     April, 2012  . Dizziness     Nonspecific, May, 2012, feeling off balance  . Weight gain     August, 2012  . Chronic diastolic CHF (congestive heart failure)   . Constipation   . Burning with urination     Past Surgical History  Procedure Laterality Date  . Cesarean section    . Tubal ligation      Patient Active Problem List   Diagnosis  Date Noted  . Neck discomfort 01/07/2014  . Burning with urination   . Constipation   . Chronic diastolic CHF (congestive heart failure)   . Weight gain   . Dizziness   . Fluid overload   . Obesity   . Palpitation   . Ejection fraction < 50%   . Tricuspid regurgitation   . Hypertension   . Shortness of breath   . Coronary artery fistula   . UTI (urinary tract infection)   . LVH (left ventricular hypertrophy)   . Pulmonary hypertension     ROS   Patient denies fever, chills, headache, sweats, rash, change in vision, change in hearing, chest pain, cough, nausea vomiting, urinary symptoms. All other systems are reviewed and are negative.  PHYSICAL EXAM  Patient is oriented to person time and place. Affect is normal. She has lost a few pounds. She feels well. Head is atraumatic. Sclera and conjunctiva were normal. There is no jugulovenous distention.  Lungs are clear. Respiratory effort is nonlabored. Cardiac exam reveals S1-S2. Abdomen is soft. There is no peripheral edema.  Filed Vitals:   04/15/14 1006  BP: 143/93  Pulse: 75  Height:  (1.6 m)  Weight: 235 lb (106.595 kg)  SpO2: 99%     ASSESSMENT & PLAN

## 2014-04-15 NOTE — Assessment & Plan Note (Signed)
Blood pressure is controlled for her. No change in therapy.

## 2014-04-15 NOTE — Patient Instructions (Signed)
Continue all current medications. Your physician wants you to follow up in:  4 months.  You will receive a reminder letter in the mail one-two months in advance.  If you don't receive a letter, please call our office to schedule the follow up appointment   

## 2014-08-09 ENCOUNTER — Other Ambulatory Visit: Payer: Self-pay | Admitting: Cardiology

## 2014-09-02 ENCOUNTER — Ambulatory Visit (INDEPENDENT_AMBULATORY_CARE_PROVIDER_SITE_OTHER): Payer: BLUE CROSS/BLUE SHIELD | Admitting: Cardiology

## 2014-09-02 ENCOUNTER — Encounter: Payer: Self-pay | Admitting: Cardiology

## 2014-09-02 VITALS — BP 144/85 | HR 85 | Ht 63.0 in | Wt 237.0 lb

## 2014-09-02 DIAGNOSIS — I5032 Chronic diastolic (congestive) heart failure: Secondary | ICD-10-CM

## 2014-09-02 DIAGNOSIS — I10 Essential (primary) hypertension: Secondary | ICD-10-CM

## 2014-09-02 DIAGNOSIS — R0602 Shortness of breath: Secondary | ICD-10-CM

## 2014-09-02 MED ORDER — AMLODIPINE BESYLATE 10 MG PO TABS: 10.0000 mg | ORAL_TABLET | Freq: Every day | ORAL | Status: DC

## 2014-09-02 MED ORDER — METOPROLOL SUCCINATE ER 50 MG PO TB24: 50.0000 mg | ORAL_TABLET | Freq: Every day | ORAL | Status: DC

## 2014-09-02 NOTE — Assessment & Plan Note (Signed)
Her blood pressure continues to be very nicely controlled. I'm very pleased.

## 2014-09-02 NOTE — Assessment & Plan Note (Signed)
She is not having any significant shortness of breath.  No change in therapy 

## 2014-09-02 NOTE — Progress Notes (Signed)
HPI Patient is seen back today to follow-up her hypertensive disease. I saw her last September, 2015. We had arranged for a follow-up 2-D echo. She had excellent systolic function. Her pulmonary pressures had normalized. It is very clear that treating her blood pressure has had an enormously beneficial effect on her. Today she returns feeling well.  Allergies  Allergen Reactions  . Penicillins   . Sulfonamide Derivatives     Current Outpatient Prescriptions  Medication Sig Dispense Refill  . amLODipine (NORVASC) 10 MG tablet Take 1 tablet (10 mg total) by mouth daily. 30 tablet 6  . furosemide (LASIX) 40 MG tablet Take 2 tablets (80 mg total) by mouth daily as needed. TAKE 2 TABLET BY MOUTH AS NEEDED 30 tablet 3  . guanFACINE (TENEX) 2 MG tablet TAKE ONE(1) TABLET BY MOUTH AT BEDTIME 30 tablet 6  . ibuprofen (ADVIL,MOTRIN) 400 MG tablet Take 400 mg by mouth every 8 (eight) hours as needed.     . metoprolol succinate (TOPROL-XL) 50 MG 24 hr tablet Take 1 tablet (50 mg total) by mouth daily. 30 tablet 6  . omeprazole (PRILOSEC) 20 MG capsule Take 20 mg by mouth as needed.     No current facility-administered medications for this visit.    History   Social History  . Marital Status: Married    Spouse Name: N/A    Number of Children: 2  . Years of Education: N/A   Occupational History  . full time    Social History Main Topics  . Smoking status: Never Smoker   . Smokeless tobacco: Never Used  . Alcohol Use: No  . Drug Use: No  . Sexual Activity: Not on file   Other Topics Concern  . Not on file   Social History Narrative    Family History  Problem Relation Age of Onset  . Cancer Other     Past Medical History  Diagnosis Date  . Obesity   . Migraine   . Anxiety   . Palpitation   . Ejection fraction < 50%     EF 35-40%...echo...january 27,2011/cardiac cta EF 51%...09/05/2009 LVH..moderate...echo january 2011  . Tricuspid regurgitation     moderate  ...echo..january 2011  . Hypertension     severe,but very responsive to amlodipine and hydrochlorithizide  overweight  . Shortness of breath   . Coronary artery fistula     ??? by echo 08/2009../..cardiac CTA...09/05/2009...no coronary fistula  . UTI (urinary tract infection)     recurrent  . LVH (left ventricular hypertrophy)     Moderate echo, January, 2011  . Pulmonary hypertension     60 mmHg echo January, 2011 / normal pulmonary arteries by CT angiography  . Fluid overload     April, 2012  . Dizziness     Nonspecific, May, 2012, feeling off balance  . Weight gain     August, 2012  . Chronic diastolic CHF (congestive heart failure)   . Constipation   . Burning with urination     Past Surgical History  Procedure Laterality Date  . Cesarean section    . Tubal ligation      Patient Active Problem List   Diagnosis Date Noted  . Neck discomfort 01/07/2014  . Burning with urination   . Constipation   . Chronic diastolic CHF (congestive heart failure)   . Weight gain   . Dizziness   . Fluid overload   . Obesity   . Palpitation   . Ejection fraction <  50%   . Tricuspid regurgitation   . Hypertension   . Shortness of breath   . Coronary artery fistula   . UTI (urinary tract infection)   . LVH (left ventricular hypertrophy)   . Pulmonary hypertension     ROS  Patient denies fever, chills, headache, sweats, rash, change in vision, change in hearing, chest pain, cough, nausea or vomiting, urinary symptoms. All other systems are reviewed and are negative.  PHYSICAL EXAM Patient is oriented to person time and place. Affect is normal. Head is atraumatic. Sclera and conjunctiva are normal. There is no jugulovenous distention. Lungs are clear. Respiratory effort is nonlabored. Cardiac exam reveals an S1 and S2. Abdomen is soft. There is no peripheral edema. She is overweight. Her weight is stable.  Filed Vitals:   09/02/14 1434  BP: 144/85  Pulse: 85  Height:  (1.6 m)   Weight: 237 lb (107.502 kg)  SpO2: 97%   EKG is not done today.  ASSESSMENT & PLAN

## 2014-09-02 NOTE — Assessment & Plan Note (Signed)
Volume status is well controlled. No change in therapy.

## 2014-09-02 NOTE — Patient Instructions (Signed)
Your physician wants you to follow-up in: 6 MONTHS WITH DR. KATZ You will receive a reminder letter in the mail two months in advance. If you don't receive a letter, please call our office to schedule the follow-up appointment.  Your physician recommends that you continue on your current medications as directed. Please refer to the Current Medication list given to you today.  Thank you for choosing  [facility]!!

## 2014-09-06 ENCOUNTER — Telehealth: Payer: Self-pay | Admitting: Cardiology

## 2014-09-06 NOTE — Telephone Encounter (Signed)
[name] called stating that she needs a new note written for her work. The note needs to say why she was here and she did not work on Friday. States it needs to include blood pressure issues. Please fax note to attention: [name]  @ [phone]

## 2014-09-07 ENCOUNTER — Encounter: Payer: Self-pay | Admitting: *Deleted

## 2014-09-07 NOTE — Telephone Encounter (Signed)
New note faxed to the number listed below per patient request.

## 2015-01-09 ENCOUNTER — Telehealth: Payer: Self-pay | Admitting: *Deleted

## 2015-01-09 DIAGNOSIS — R002 Palpitations: Secondary | ICD-10-CM

## 2015-01-09 NOTE — Telephone Encounter (Signed)
Pt requesting to see Dr. Myrtis SerKatz due to having palpitations and "skipped beats" frequently in the last week and fatigue denies chest pain/dizziness. Pt says Dr. Myrtis SerKatz is aware that this occurs occasionally. Pt wants to know if she needs to wear a monitor prior to appointment. [place]. Will forward to Dr. Myrtis SerKatz

## 2015-01-09 NOTE — Telephone Encounter (Signed)
Pt will come tomorrow and have 48 holter placed. Added to nurse schedule/orders placed and pt made aware.

## 2015-01-09 NOTE — Telephone Encounter (Signed)
Let's arrange for her to wear a 48-hour monitor to be done and completed before she is due back in the office.

## 2015-01-10 ENCOUNTER — Encounter: Payer: Self-pay | Admitting: *Deleted

## 2015-01-10 ENCOUNTER — Encounter (INDEPENDENT_AMBULATORY_CARE_PROVIDER_SITE_OTHER): Payer: BLUE CROSS/BLUE SHIELD

## 2015-01-10 DIAGNOSIS — R002 Palpitations: Secondary | ICD-10-CM | POA: Diagnosis not present

## 2015-01-12 ENCOUNTER — Encounter: Payer: Self-pay | Admitting: *Deleted

## 2015-01-19 ENCOUNTER — Encounter: Payer: Self-pay | Admitting: *Deleted

## 2015-01-19 ENCOUNTER — Encounter: Payer: Self-pay | Admitting: Cardiology

## 2015-01-19 ENCOUNTER — Ambulatory Visit (INDEPENDENT_AMBULATORY_CARE_PROVIDER_SITE_OTHER): Payer: BLUE CROSS/BLUE SHIELD | Admitting: Cardiology

## 2015-01-19 VITALS — BP 144/88 | HR 65 | Ht 63.0 in | Wt 234.0 lb

## 2015-01-19 DIAGNOSIS — I10 Essential (primary) hypertension: Secondary | ICD-10-CM | POA: Diagnosis not present

## 2015-01-19 DIAGNOSIS — R002 Palpitations: Secondary | ICD-10-CM

## 2015-01-19 DIAGNOSIS — I5032 Chronic diastolic (congestive) heart failure: Secondary | ICD-10-CM

## 2015-01-19 MED ORDER — FUROSEMIDE 40 MG PO TABS: 80.0000 mg | ORAL_TABLET | Freq: Every day | ORAL | Status: DC | PRN

## 2015-01-19 NOTE — Progress Notes (Signed)
Cardiology Office Note   Date:  01/19/2015   ID:  [name], DOB [birth year], MRN [id number]  PCP:  Ardyth Man, MD  Cardiologist:  Willa Rough, MD   Chief Complaint  Patient presents with  . Appointment    Follow-up blood pressure and palpitations      History of Present Illness: [name] is a 39 y.o. female who presents today to follow-up hypertension and to assess palpitations. I saw her last January, 2016. Recently she had a period of time of feeling palpitations. She also had a period of time of multiple other symptoms that did not appear to be cardiac. She wrote me a note which I reviewed before today's visit. She tells me today that these all occurred while she was on a vacation. All of those symptoms have improved. She continues to feel rare palpitations.  In preparation for today's visit she wore a 48-hour Holter. I reviewed carefully. With multiple symptoms she had no significant arrhythmias. There were very rare PVCs. It is possible that she may have spelled some increased heart rate in the range of 100-110.    Past Medical History  Diagnosis Date  . Obesity   . Migraine   . Anxiety   . Palpitation   . Ejection fraction < 50%     EF 35-40%...echo...january 27,2011/cardiac cta EF 51%...09/05/2009 LVH..moderate...echo january 2011  . Tricuspid regurgitation     moderate ...echo..january 2011  . Hypertension     severe,but very responsive to amlodipine and hydrochlorithizide  overweight  . Shortness of breath   . Coronary artery fistula     ??? by echo 08/2009../..cardiac CTA...09/05/2009...no coronary fistula  . UTI (urinary tract infection)     recurrent  . LVH (left ventricular hypertrophy)     Moderate echo, January, 2011  . Pulmonary hypertension     60 mmHg echo January, 2011 / normal pulmonary arteries by CT angiography  . Fluid overload     April, 2012  . Dizziness     Nonspecific, May, 2012, feeling off balance  . Weight gain     August, 2012  .  Chronic diastolic CHF (congestive heart failure)   . Constipation   . Burning with urination     Past Surgical History  Procedure Laterality Date  . Cesarean section    . Tubal ligation      Patient Active Problem List   Diagnosis Date Noted  . Neck discomfort 01/07/2014  . Burning with urination   . Constipation   . Chronic diastolic CHF (congestive heart failure)   . Weight gain   . Dizziness   . Fluid overload   . Obesity   . Palpitation   . Ejection fraction < 50%   . Tricuspid regurgitation   . Hypertension   . Shortness of breath   . Coronary artery fistula   . UTI (urinary tract infection)   . LVH (left ventricular hypertrophy)   . Pulmonary hypertension       Current Outpatient Prescriptions  Medication Sig Dispense Refill  . amLODipine (NORVASC) 10 MG tablet Take 1 tablet (10 mg total) by mouth daily. 30 tablet 6  . furosemide (LASIX) 40 MG tablet Take 2 tablets (80 mg total) by mouth daily as needed. TAKE 2 TABLET BY MOUTH AS NEEDED 30 tablet 3  . guanFACINE (TENEX) 2 MG tablet TAKE ONE(1) TABLET BY MOUTH AT BEDTIME 30 tablet 6  . ibuprofen (ADVIL,MOTRIN) 400 MG tablet Take 400 mg by mouth every 8 (  eight) hours as needed.     . metoprolol succinate (TOPROL-XL) 50 MG 24 hr tablet Take 1 tablet (50 mg total) by mouth daily. 30 tablet 6  . omeprazole (PRILOSEC) 20 MG capsule Take 20 mg by mouth as needed.     No current facility-administered medications for this visit.    Allergies:   Penicillins and Sulfonamide derivatives    Social History:  The patient  reports that she has never smoked. She has never used smokeless tobacco. She reports that she does not drink alcohol or use illicit drugs.   Family History:  The patient's family history includes Cancer in her other.    ROS:  Please see the history of present illness.   Patient denies fever, chills, headache, sweats, rash, change in vision, change in hearing, chest pain, cough, nausea or vomiting, urinary  symptoms. All other systems are reviewed and are negative.     PHYSICAL EXAM: VS:  BP 144/88 mmHg  Pulse 65  Ht 5\' 3"  (1.6 m)  Wt 234 lb (106.142 kg)  BMI 41.46 kg/m2  SpO2 100% , Patient is oriented to person time and place. Affect is normal. Head is atraumatic. Sclera and conjunctiva are normal. There is no jugular venous distention. Lungs are clear. Respiratory effort is nonlabored. Cardiac exam reveals S1 and S2. Abdomen is soft. Does no peripheral edema. There are no musculoskeletal deformities per there are no skin rashes.  EKG:   EKG is not done today. I have outlined the results of her 48 hour monitor.   Recent Labs: No results found for requested labs within last 365 days.    Lipid Panel No results found for: CHOL, TRIG, HDL, CHOLHDL, VLDL, LDLCALC, LDLDIRECT    Wt Readings from Last 3 Encounters:  01/19/15 234 lb (106.142 kg)  09/02/14 237 lb (107.502 kg)  04/15/14 235 lb (106.595 kg)      Current medicines are reviewed  The patient understands her medications.     ASSESSMENT AND PLAN:

## 2015-01-19 NOTE — Assessment & Plan Note (Signed)
Blood pressure is well controlled. No change in therapy. 

## 2015-01-19 NOTE — Assessment & Plan Note (Signed)
I explained to the patient that she might sense rare PVCs. I explained that her monitor revealed no significant abnormalities. I mentioned that she might sense increased heart rate at times. She is artery on a beta blocker. I've chosen not to adjust her meds. No further workup.

## 2015-01-19 NOTE — Patient Instructions (Signed)
Your physician recommends that you continue on your current medications as directed. Please refer to the Current Medication list given to you today. Your physician recommends that you schedule a follow-up appointment in: 04/19/15 with Dr. Katz. 

## 2015-01-19 NOTE — Assessment & Plan Note (Signed)
Volume is well controlled. No change in therapy.

## 2015-03-13 ENCOUNTER — Other Ambulatory Visit: Payer: Self-pay | Admitting: *Deleted

## 2015-03-13 MED ORDER — GUANFACINE HCL 2 MG PO TABS: 2.0000 mg | ORAL_TABLET | Freq: Every day | ORAL | Status: DC

## 2015-04-12 ENCOUNTER — Other Ambulatory Visit: Payer: Self-pay | Admitting: *Deleted

## 2015-04-12 MED ORDER — METOPROLOL SUCCINATE ER 50 MG PO TB24: 50.0000 mg | ORAL_TABLET | Freq: Every day | ORAL | Status: DC

## 2015-04-19 ENCOUNTER — Ambulatory Visit (INDEPENDENT_AMBULATORY_CARE_PROVIDER_SITE_OTHER): Payer: BLUE CROSS/BLUE SHIELD | Admitting: Cardiology

## 2015-04-19 ENCOUNTER — Encounter: Payer: Self-pay | Admitting: Cardiology

## 2015-04-19 ENCOUNTER — Encounter: Payer: Self-pay | Admitting: *Deleted

## 2015-04-19 VITALS — BP 136/87 | HR 66 | Ht 63.0 in | Wt 233.0 lb

## 2015-04-19 DIAGNOSIS — I270 Primary pulmonary hypertension: Secondary | ICD-10-CM

## 2015-04-19 DIAGNOSIS — R002 Palpitations: Secondary | ICD-10-CM | POA: Diagnosis not present

## 2015-04-19 DIAGNOSIS — I272 Pulmonary hypertension, unspecified: Secondary | ICD-10-CM

## 2015-04-19 DIAGNOSIS — I10 Essential (primary) hypertension: Secondary | ICD-10-CM

## 2015-04-19 DIAGNOSIS — I071 Rheumatic tricuspid insufficiency: Secondary | ICD-10-CM

## 2015-04-19 DIAGNOSIS — R0989 Other specified symptoms and signs involving the circulatory and respiratory systems: Secondary | ICD-10-CM | POA: Diagnosis not present

## 2015-04-19 DIAGNOSIS — I5032 Chronic diastolic (congestive) heart failure: Secondary | ICD-10-CM | POA: Diagnosis not present

## 2015-04-19 DIAGNOSIS — I253 Aneurysm of heart: Secondary | ICD-10-CM | POA: Diagnosis not present

## 2015-04-19 DIAGNOSIS — I2541 Coronary artery aneurysm: Secondary | ICD-10-CM

## 2015-04-19 DIAGNOSIS — R9430 Abnormal result of cardiovascular function study, unspecified: Secondary | ICD-10-CM

## 2015-04-19 MED ORDER — AMLODIPINE BESYLATE 10 MG PO TABS: 10.0000 mg | ORAL_TABLET | Freq: Every day | ORAL | Status: DC

## 2015-04-19 NOTE — Assessment & Plan Note (Signed)
Originally we had a great deal of difficulty controlling her blood pressure. A good combination of meds was finally found. Her blood pressure has been quite stable on her current medicines. No further workup.

## 2015-04-19 NOTE — Assessment & Plan Note (Signed)
In 2011 she had significant pulmonary hypertension. Pulmonary arteries were normal by CT angiography. Follow-up echoes have shown normalization of her pulmonary artery pressures.

## 2015-04-19 NOTE — Patient Instructions (Signed)
Your physician recommends that you continue on your current medications as directed. Please refer to the Current Medication list given to you today. Your physician recommends that you schedule a follow-up appointment in: 4 months. You will receive a reminder letter in the mail in about 1-2 months reminding you to call and schedule your appointment. If you don't receive this letter, please contact our office. 

## 2015-04-19 NOTE — Assessment & Plan Note (Addendum)
In 2011 her ejection fraction was 35-40%. After that time cardiac CTA was done in 2011. There was no significant coronary disease. Blood pressure was treated aggressively over time. She had return of normal LV function and RV function. Her last echo in August, 2015 revealed an ejection fraction of 65% with normal right ventricular function.

## 2015-04-19 NOTE — Assessment & Plan Note (Addendum)
In 2011 she had moderate tricuspid regurgitation. Follow-up echoes have shown only mild tricuspid regurgitation.  I feel it will be good for the patient to continue to follow here in our office in [facility] for her cardiology follow-up. She is in agreement.

## 2015-04-19 NOTE — Progress Notes (Signed)
Cardiology Office Note   Date:  04/19/2015   ID:  [name], DOB 25-Mar-1976, MRN [id number]  PCP:  Ardyth Man, MD  Cardiologist:  Willa Rough, MD   Chief Complaint  Patient presents with  . Appointment    Follow-up hypertensive cardiovascular disease      History of Present Illness: [name] is a 39 y.o. female who presents today to follow-up hypertensive cardiovascular disease. She is a long-time special patient. Originally she had severe hypertension and significant left ventricular dysfunction. She has been evaluated completely. The details are outlined in the problem list below. Fortunately we were able to control her blood pressure. Left ventricular function improved. Pulmonary hypertension also improved. She is doing well. She is overweight. She's not having any significant chest pain or palpitations.    Past Medical History  Diagnosis Date  . Obesity   . Migraine   . Anxiety   . Palpitation   . Ejection fraction < 50%     EF 35-40%...echo...january 27,2011/cardiac cta EF 51%...09/05/2009 LVH..moderate...echo january 2011  . Tricuspid regurgitation     moderate ...echo..january 2011  . Hypertension     severe,but very responsive to amlodipine and hydrochlorithizide  overweight  . Shortness of breath   . Coronary artery fistula     ??? by echo 08/2009../..cardiac CTA...09/05/2009...no coronary fistula  . UTI (urinary tract infection)     recurrent  . LVH (left ventricular hypertrophy)     Moderate echo, January, 2011  . Pulmonary hypertension     60 mmHg echo January, 2011 / normal pulmonary arteries by CT angiography  . Fluid overload     April, 2012  . Dizziness     Nonspecific, May, 2012, feeling off balance  . Weight gain     August, 2012  . Chronic diastolic CHF (congestive heart failure)   . Constipation   . Burning with urination     Past Surgical History  Procedure Laterality Date  . Cesarean section    . Tubal ligation      Patient  Active Problem List   Diagnosis Date Noted  . Neck discomfort 01/07/2014  . Burning with urination   . Constipation   . Chronic diastolic CHF (congestive heart failure)   . Weight gain   . Dizziness   . Fluid overload   . Obesity   . Palpitation   . Ejection fraction < 50%   . Tricuspid regurgitation   . Hypertension   . Shortness of breath   . Coronary artery fistula   . UTI (urinary tract infection)   . LVH (left ventricular hypertrophy)   . Pulmonary hypertension       Current Outpatient Prescriptions  Medication Sig Dispense Refill  . amLODipine (NORVASC) 10 MG tablet Take 1 tablet (10 mg total) by mouth daily. 30 tablet 6  . furosemide (LASIX) 40 MG tablet Take 2 tablets (80 mg total) by mouth daily as needed. 30 tablet 3  . guanFACINE (TENEX) 2 MG tablet Take 1 tablet (2 mg total) by mouth at bedtime. 30 tablet 6  . ibuprofen (ADVIL,MOTRIN) 400 MG tablet Take 400 mg by mouth every 8 (eight) hours as needed.     . metoprolol succinate (TOPROL-XL) 50 MG 24 hr tablet Take 1 tablet (50 mg total) by mouth daily. 30 tablet 6  . omeprazole (PRILOSEC) 20 MG capsule Take 20 mg by mouth as needed.     No current facility-administered medications for this visit.    Allergies:  Penicillins and Sulfonamide derivatives    Social History:  The patient  reports that she has never smoked. She has never used smokeless tobacco. She reports that she does not drink alcohol or use illicit drugs.   Family History:  The patient's family history includes Cancer in her other.    ROS:  Please see the history of present illness.    Patient denies fever, chills, headache, sweats, rash, change in vision, change in hearing, chest pain, cough, nausea or vomiting, urinary symptoms. She mentions that she has missed 2 periods and this is being assessed fully by her primary care team. She had a recent upper respiratory infection that is improving. All other systems are reviewed and are  negative.   PHYSICAL EXAM: VS:  BP 136/87 mmHg  Pulse 66  Ht 5\' 3"  (1.6 m)  Wt 233 lb (105.688 kg)  BMI 41.28 kg/m2  SpO2 100% , Patient is stable. She is oriented to person time and place. Affect is normal. She is overweight. Head is atraumatic. Sclera and conjunctiva are normal. There is no jugular venous distention. Lungs are clear. Respiratory effort is not labored. Cardiac exam reveals S1 and S2. There are no clicks or significant murmurs. Abdomen is soft. There is no peripheral edema. There are no musculoskeletal deformities. There are no skin rashes.   EKG:   EKG is done today and reviewed by me. The EKG is normal.   Recent Labs: No results found for requested labs within last 365 days.    Lipid Panel No results found for: CHOL, TRIG, HDL, CHOLHDL, VLDL, LDLCALC, LDLDIRECT    Wt Readings from Last 3 Encounters:  04/19/15 233 lb (105.688 kg)  01/19/15 234 lb (106.142 kg)  09/02/14 237 lb (107.502 kg)      Current medicines are reviewed  Patient understands her medications.     ASSESSMENT AND PLAN:

## 2015-04-19 NOTE — Assessment & Plan Note (Signed)
In the past there was question if she could have a coronary fistula. She had a cardiac CTA in 2011. No fistula was seen.

## 2015-04-19 NOTE — Assessment & Plan Note (Signed)
The patient has chronic diastolic CHF. Her volume status is stable and she's doing well. No further workup needed.

## 2015-08-28 ENCOUNTER — Encounter: Payer: Self-pay | Admitting: Cardiovascular Disease

## 2015-08-28 ENCOUNTER — Ambulatory Visit (INDEPENDENT_AMBULATORY_CARE_PROVIDER_SITE_OTHER): Payer: BLUE CROSS/BLUE SHIELD | Admitting: Cardiovascular Disease

## 2015-08-28 ENCOUNTER — Encounter: Payer: Self-pay | Admitting: *Deleted

## 2015-08-28 VITALS — BP 136/98 | HR 83 | Ht 63.0 in | Wt 236.0 lb

## 2015-08-28 DIAGNOSIS — I119 Hypertensive heart disease without heart failure: Secondary | ICD-10-CM | POA: Diagnosis not present

## 2015-08-28 DIAGNOSIS — I5032 Chronic diastolic (congestive) heart failure: Secondary | ICD-10-CM

## 2015-08-28 DIAGNOSIS — I10 Essential (primary) hypertension: Secondary | ICD-10-CM

## 2015-08-28 MED ORDER — GUANFACINE HCL 2 MG PO TABS: 2.0000 mg | ORAL_TABLET | Freq: Every day | ORAL | Status: DC

## 2015-08-28 NOTE — Progress Notes (Signed)
Patient ID: [name], female   DOB: 1975/08/22, 40 y.o.   MRN: [id number]      SUBJECTIVE: The patient is a 39 year old woman with a history of hypertensive heart disease with chronic diastolic heart failure. She developed a hypertensive cardiopathy but with good blood pressure control LV systolic function normalized.  Echocardiogram on 03/23/14 demonstrated vigorous left ventricular systolic function and normal regional wall motion, LVEF 65-70%, grade 2 diastolic dysfunction with increased filling pressures, mild to moderate left atrial dilatation, and mild right atrial dilatation.  She underwent unremarkable Holter monitoring in June 2016.  She denies chest pain and shortness of breath. Takes Lasix prn primarily around the time of her menstrual cycles.  Has had elevated BP's lately but attributes it to Provera, which she is stopping tonight.   Also has left foot pain for which she is starting ibuprofen/famotidine combined pill.  Review of Systems: As per "subjective", otherwise negative.  Allergies  Allergen Reactions  . Penicillins   . Sulfonamide Derivatives     Current Outpatient Prescriptions  Medication Sig Dispense Refill  . amLODipine (NORVASC) 10 MG tablet Take 1 tablet (10 mg total) by mouth daily. 30 tablet 6  . furosemide (LASIX) 40 MG tablet Take 2 tablets (80 mg total) by mouth daily as needed. 30 tablet 3  . guanFACINE (TENEX) 2 MG tablet Take 1 tablet (2 mg total) by mouth at bedtime. 30 tablet 6  . Ibuprofen-Famotidine (DUEXIS) 800-26.6 MG TABS Take by mouth.    . medroxyPROGESTERone (PROVERA) 10 MG tablet Take 10 mg by mouth daily.    . metoprolol succinate (TOPROL-XL) 50 MG 24 hr tablet Take 1 tablet (50 mg total) by mouth daily. 30 tablet 6  . omeprazole (PRILOSEC) 20 MG capsule Take 20 mg by mouth as needed.     No current facility-administered medications for this visit.    Past Medical History  Diagnosis Date  . Obesity   . Migraine   . Anxiety   .  Palpitation   . Ejection fraction < 50%     EF 35-40%...echo...january 27,2011/cardiac cta EF 51%...09/05/2009 LVH..moderate...echo january 2011  . Tricuspid regurgitation     moderate ...echo..january 2011  . Hypertension     severe,but very responsive to amlodipine and hydrochlorithizide  overweight  . Shortness of breath   . Coronary artery fistula     ??? by echo 08/2009../..cardiac CTA...09/05/2009...no coronary fistula  . UTI (urinary tract infection)     recurrent  . LVH (left ventricular hypertrophy)     Moderate echo, January, 2011  . Pulmonary hypertension (HCC)     60 mmHg echo January, 2011 / normal pulmonary arteries by CT angiography  . Fluid overload     April, 2012  . Dizziness     Nonspecific, May, 2012, feeling off balance  . Weight gain     August, 2012  . Chronic diastolic CHF (congestive heart failure) (HCC)   . Constipation   . Burning with urination     Past Surgical History  Procedure Laterality Date  . Cesarean section    . Tubal ligation      Social History   Social History  . Marital Status: Married    Spouse Name: N/A  . Number of Children: 2  . Years of Education: N/A   Occupational History  . full time    Social History Main Topics  . Smoking status: Never Smoker   . Smokeless tobacco: Never Used  . Alcohol Use: No  .  Drug Use: No  . Sexual Activity: Not on file   Other Topics Concern  . Not on file   Social History Narrative     Filed Vitals:   08/28/15 1622  BP: 136/98  Pulse: 83  Height:  (1.6 m)  Weight: 236 lb (107.049 kg)  SpO2: 98%    PHYSICAL EXAM General: NAD HEENT: Normal. Neck: No JVD, no thyromegaly. Lungs: Clear to auscultation bilaterally with normal respiratory effort. CV: Nondisplaced PMI.  Regular rate and rhythm, normal S1/S2, no S3/S4, no murmur. No pretibial or periankle edema.  No carotid bruit.    Abdomen: Soft, nontender, obese.  Neurologic: Alert and oriented x 3.  Psych: Normal  affect. Skin: Normal. Musculoskeletal: No gross deformities. Extremities: No clubbing or cyanosis.   ECG: Most recent ECG reviewed.      ASSESSMENT AND PLAN: 1. 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 However, may very well be due to Provera and right foot pain. I have asked her to check BP at home three times per week for the next 2 months. If it remains elevated, will add Diovan 160 mg daily.  Dispo: f/u 6 months.   Prentice Docker, M.D., F.A.C.C.

## 2015-08-28 NOTE — Patient Instructions (Signed)
   Patient stopping Provera - removed medication from list today. Continue all other medications.   Your physician has requested that you regularly monitor and record your blood pressure readings at home. Please take your readings at varied times of the day 3 x week for 2 months.  Return to office for MD review.   Your physician wants you to follow up in: 6 months.  You will receive a reminder letter in the mail one-two months in advance.  If you don't receive a letter, please call our office to schedule the follow up appointment

## 2015-09-01 ENCOUNTER — Ambulatory Visit: Payer: BLUE CROSS/BLUE SHIELD | Admitting: Cardiovascular Disease

## 2015-11-16 ENCOUNTER — Other Ambulatory Visit: Payer: Self-pay | Admitting: Cardiology

## 2015-12-21 ENCOUNTER — Telehealth: Payer: Self-pay | Admitting: *Deleted

## 2015-12-21 NOTE — Telephone Encounter (Signed)
Fax received from Dr. Brynda Rimhan [place]'s office - BP readings.  12-19-15 = 154/96  72     Recheck 142/92  12-12-15 = 157/100  11-15-15 = 134/90  11-08-15 = 137/95  09-25-15 = 146/99  09-19-15 = 154/105  07-27-15 = 160/104

## 2015-12-21 NOTE — Telephone Encounter (Signed)
Start Diovan 80 mg once daily.

## 2015-12-22 NOTE — Telephone Encounter (Signed)
Left message to return call 

## 2015-12-29 NOTE — Telephone Encounter (Signed)
Home # - Left message to return call.  Mobile # - left message on voice mail regarding below.

## 2016-01-02 ENCOUNTER — Encounter: Payer: Self-pay | Admitting: *Deleted

## 2016-01-02 MED ORDER — VALSARTAN 80 MG PO TABS: 80.0000 mg | ORAL_TABLET | Freq: Every day | ORAL | Status: DC

## 2016-01-02 NOTE — Telephone Encounter (Signed)
Attempted to notify patient again.  Left information below on voice mail.  [facility] & also send note in the mail.

## 2016-03-04 ENCOUNTER — Ambulatory Visit: Payer: BLUE CROSS/BLUE SHIELD | Admitting: Cardiovascular Disease

## 2016-03-22 ENCOUNTER — Ambulatory Visit: Payer: BLUE CROSS/BLUE SHIELD | Admitting: Cardiovascular Disease

## 2016-03-22 ENCOUNTER — Telehealth: Payer: Self-pay | Admitting: *Deleted

## 2016-03-22 ENCOUNTER — Encounter: Payer: Self-pay | Admitting: *Deleted

## 2016-03-22 NOTE — Telephone Encounter (Signed)
Patient given work note for office visit today. Office staff unable to contact patient prior to office visit to reschedule. Patient rescheduled upon arrival.

## 2016-04-19 ENCOUNTER — Ambulatory Visit (INDEPENDENT_AMBULATORY_CARE_PROVIDER_SITE_OTHER): Payer: BLUE CROSS/BLUE SHIELD | Admitting: Cardiovascular Disease

## 2016-04-19 ENCOUNTER — Encounter: Payer: Self-pay | Admitting: *Deleted

## 2016-04-19 ENCOUNTER — Encounter: Payer: Self-pay | Admitting: Cardiovascular Disease

## 2016-04-19 VITALS — BP 140/90 | HR 74 | Ht 63.0 in | Wt 231.6 lb

## 2016-04-19 DIAGNOSIS — R600 Localized edema: Secondary | ICD-10-CM

## 2016-04-19 DIAGNOSIS — I5032 Chronic diastolic (congestive) heart failure: Secondary | ICD-10-CM | POA: Diagnosis not present

## 2016-04-19 DIAGNOSIS — I119 Hypertensive heart disease without heart failure: Secondary | ICD-10-CM

## 2016-04-19 MED ORDER — FUROSEMIDE 20 MG PO TABS: 20.0000 mg | ORAL_TABLET | ORAL | 2 refills | Status: DC | PRN

## 2016-04-19 NOTE — Progress Notes (Signed)
SUBJECTIVE: The patient presents for routine follow-up. She has a history of hypertensive heart disease with chronic diastolic heart failure. She developed a hypertensive cardiopathy but with good blood pressure control LV systolic function normalized.  Echocardiogram on 03/23/14 demonstrated vigorous left ventricular systolic function and normal regional wall motion, LVEF 65-70%, grade 2 diastolic dysfunction with increased filling pressures, mild to moderate left atrial dilatation, and mild right atrial dilatation.  She underwent unremarkable Holter monitoring in June 2016.  She denies chest pain and shortness of breath. Takes Lasix prn primarily around the time of her menstrual cycles but has had more frequent swelling and ran out of Lasix. Primarily limited to ankles.   Review of Systems: As per "subjective", otherwise negative.  Allergies  Allergen Reactions  . Penicillins   . Sulfonamide Derivatives     Current Outpatient Prescriptions  Medication Sig Dispense Refill  . amLODipine (NORVASC) 10 MG tablet TAKE 1 TABLET BY MOUTH EVERY DAY 30 tablet 5  . guanFACINE (TENEX) 2 MG tablet Take 1 tablet (2 mg total) by mouth at bedtime. 30 tablet 6  . metoprolol succinate (TOPROL-XL) 50 MG 24 hr tablet TAKE 1 TABLET BY MOUTH EVERY DAY 30 tablet 5   No current facility-administered medications for this visit.     Past Medical History:  Diagnosis Date  . Anxiety   . Burning with urination   . Chronic diastolic CHF (congestive heart failure) (HCC)   . Constipation   . Coronary artery fistula    ??? by echo 08/2009../..cardiac CTA...09/05/2009...no coronary fistula  . Dizziness    Nonspecific, May, 2012, feeling off balance  . Ejection fraction < 50%    EF 35-40%...echo...january 27,2011/cardiac cta EF 51%...09/05/2009 LVH..moderate...echo january 2011  . Fluid overload    April, 2012  . Hypertension    severe,but very responsive to amlodipine and hydrochlorithizide   overweight  . LVH (left ventricular hypertrophy)    Moderate echo, January, 2011  . Migraine   . Obesity   . Palpitation   . Pulmonary hypertension (HCC)    60 mmHg echo January, 2011 / normal pulmonary arteries by CT angiography  . Shortness of breath   . Tricuspid regurgitation    moderate ...echo..january 2011  . UTI (urinary tract infection)    recurrent  . Weight gain    August, 2012    Past Surgical History:  Procedure Laterality Date  . CESAREAN SECTION    . TUBAL LIGATION      Social History   Social History  . Marital status: Married    Spouse name: N/A  . Number of children: 2  . Years of education: N/A   Occupational History  . full time [employer]   Social History Main Topics  . Smoking status: Never Smoker  . Smokeless tobacco: Never Used  . Alcohol use No  . Drug use: No  . Sexual activity: Not on file   Other Topics Concern  . Not on file   Social History Narrative  . No narrative on file     Vitals:   04/19/16 0929  BP: 140/90  Pulse: 74  Weight: 231 lb 9.6 oz (105.1 kg)  Height: 5\' 3"  (1.6 m)    PHYSICAL EXAM General: NAD HEENT: Normal. Neck: No JVD, no thyromegaly. Lungs: Clear to auscultation bilaterally with normal respiratory effort. CV: Nondisplaced PMI.  Regular rate and rhythm, normal S1/S2, no S3/S4, no murmur. No pretibial and trace periankle edema.   Abdomen: Soft, nontender,  obese, no distention.  Neurologic: Alert and oriented.  Psych: Normal affect. Skin: Normal. Musculoskeletal: No gross deformities.    ECG: Most recent ECG reviewed.      ASSESSMENT AND PLAN: 1. 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   2. Leg edema: Will provide Lasix 20 mg prn.  Dispo: 6 mo fu.   Prentice Docker, M.D., F.A.C.C.

## 2016-04-19 NOTE — Patient Instructions (Signed)
Medication Instructions:  Begin Lasix 20mg  as needed swelling. Continue all other medications.    Labwork: none  Testing/Procedures: none  Follow-Up: Your physician wants you to follow up in: 6 months.  You will receive a reminder letter in the mail one-two months in advance.  If you don't receive a letter, please call our office to schedule the follow up appointment   Any Other Special Instructions Will Be Listed Below (If Applicable).  If you need a refill on your cardiac medications before your next appointment, please call your pharmacy.

## 2016-05-22 ENCOUNTER — Other Ambulatory Visit: Payer: Self-pay | Admitting: Cardiovascular Disease

## 2016-05-31 ENCOUNTER — Other Ambulatory Visit: Payer: Self-pay | Admitting: Cardiovascular Disease

## 2016-10-28 ENCOUNTER — Encounter: Payer: Self-pay | Admitting: Cardiovascular Disease

## 2016-10-28 ENCOUNTER — Encounter: Payer: Self-pay | Admitting: *Deleted

## 2016-10-28 ENCOUNTER — Ambulatory Visit (INDEPENDENT_AMBULATORY_CARE_PROVIDER_SITE_OTHER): Payer: BLUE CROSS/BLUE SHIELD | Admitting: Cardiovascular Disease

## 2016-10-28 VITALS — BP 134/89 | HR 74 | Ht 63.0 in | Wt 238.6 lb

## 2016-10-28 DIAGNOSIS — I119 Hypertensive heart disease without heart failure: Secondary | ICD-10-CM

## 2016-10-28 DIAGNOSIS — R600 Localized edema: Secondary | ICD-10-CM | POA: Diagnosis not present

## 2016-10-28 DIAGNOSIS — I10 Essential (primary) hypertension: Secondary | ICD-10-CM

## 2016-10-28 DIAGNOSIS — I5032 Chronic diastolic (congestive) heart failure: Secondary | ICD-10-CM | POA: Diagnosis not present

## 2016-10-28 MED ORDER — VALSARTAN 80 MG PO TABS: 80.0000 mg | ORAL_TABLET | Freq: Every day | ORAL | 1 refills | Status: DC

## 2016-10-28 NOTE — Patient Instructions (Signed)
Your physician wants you to follow-up in: 6 MONTHS WITH DR. Purvis SheffieldKONESWARAN You will receive a reminder letter in the mail two months in advance. If you don't receive a letter, please call our office to schedule the follow-up appointment.  Your physician has recommended you make the following change in your medication:   STOP GUANFACINE   START DIOVAN 80 MG DAILY  Thank you for choosing [facility]!!

## 2016-10-28 NOTE — Progress Notes (Signed)
SUBJECTIVE: The patient presents for routine follow-up. She has a history of hypertensive heart disease with chronic diastolic heart failure. She developed a hypertensive cardiopathy but with good blood pressure control LV systolic function normalized.  Echocardiogram on 03/23/14 demonstrated vigorous left ventricular systolic function and normal regional wall motion, LVEF 65-70%, grade 2 diastolic dysfunction with increased filling pressures, mild to moderate left atrial dilatation, and mild right atrial dilatation.  She underwent unremarkable Holter monitoring in June 2016.  She denies chest pain and shortness of breath.   She has been having problems with her left foot and is scheduled to undergo an MRI today to evaluate for tendon rupture. She has been taking NSAIDs. She said blood pressure has been elevated to 140-150/100 at other healthcare providers offices. She tells me a series of labs were recently checked and were found to be normal.  ECG performed in the office today which I personally reviewed demonstrates normal sinus rhythm with no ischemic ST segment or T-wave abnormalities, nor any arrhythmias.    Review of Systems: As per "subjective", otherwise negative.  Allergies  Allergen Reactions  . Penicillins   . Sulfonamide Derivatives     Current Outpatient Prescriptions  Medication Sig Dispense Refill  . amLODipine (NORVASC) 10 MG tablet TAKE 1 TAB DAILY 30 tablet 6  . furosemide (LASIX) 20 MG tablet Take 1 tablet (20 mg total) by mouth as needed for edema (swelling). 30 tablet 2  . guanFACINE (TENEX) 2 MG tablet TAKE 1 TABLET (2 MG TOTAL) BY MOUTH AT BEDTIME. 30 tablet 6  . metoprolol succinate (TOPROL-XL) 50 MG 24 hr tablet TAKE 1 TAB DAILY 30 tablet 6  . omeprazole (PRILOSEC) 20 MG capsule Take 20 mg by mouth daily as needed.     No current facility-administered medications for this visit.     Past Medical History:  Diagnosis Date  . Anxiety   . Burning  with urination   . Chronic diastolic CHF (congestive heart failure) (HCC)   . Constipation   . Coronary artery fistula    ??? by echo 08/2009../..cardiac CTA...09/05/2009...no coronary fistula  . Dizziness    Nonspecific, May, 2012, feeling off balance  . Ejection fraction < 50%    EF 35-40%...echo...january 27,2011/cardiac cta EF 51%...09/05/2009 LVH..moderate...echo january 2011  . Fluid overload    April, 2012  . Hypertension    severe,but very responsive to amlodipine and hydrochlorithizide  overweight  . LVH (left ventricular hypertrophy)    Moderate echo, January, 2011  . Migraine   . Obesity   . Palpitation   . Pulmonary hypertension    60 mmHg echo January, 2011 / normal pulmonary arteries by CT angiography  . Shortness of breath   . Tricuspid regurgitation    moderate ...echo..january 2011  . UTI (urinary tract infection)    recurrent  . Weight gain    August, 2012    Past Surgical History:  Procedure Laterality Date  . CESAREAN SECTION    . TUBAL LIGATION      Social History   Social History  . Marital status: Married    Spouse name: N/A  . Number of children: 2  . Years of education: N/A   Occupational History  . full time [employer]   Social History Main Topics  . Smoking status: Never Smoker  . Smokeless tobacco: Never Used  . Alcohol use No  . Drug use: No  . Sexual activity: Not on file   Other Topics Concern  .  Not on file   Social History Narrative  . No narrative on file     Vitals:   10/28/16 0923 10/28/16 0926  BP: (!) 149/101 134/89  Pulse: 73 74  SpO2: 99% 98%  Weight: 238 lb 9.6 oz (108.2 kg)   Height: 5\' 3"  (1.6 m)     PHYSICAL EXAM General: NAD HEENT: Normal. Neck: No JVD, no thyromegaly. Lungs: Clear to auscultation bilaterally with normal respiratory effort. CV: Nondisplaced PMI.  Regular rate and rhythm, normal S1/S2, no S3/S4, no murmur. No pretibial and trace periankle edema.   Abdomen: Soft, nontender, obese, no  distention.  Neurologic: Alert and oriented.  Psych: Normal affect. Skin: Normal. Musculoskeletal: No gross deformities.    ECG: Most recent ECG reviewed.      ASSESSMENT AND PLAN: 1. 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 I will stop guanfacine and start Diovan 80 mg daily. She is already taking 10 mg amlodipine daily and Toprol-XL 50 mg daily. Blood pressure likely exacerbated by left foot pain and NSAID use.  2. Leg edema: Continue Lasix 20 mg prn.  Dispo: 6 mo fu.  Prentice DockerSuresh Gerold Sar, M.D., F.A.C.C.

## 2016-11-01 ENCOUNTER — Telehealth: Payer: Self-pay | Admitting: Cardiovascular Disease

## 2016-11-01 NOTE — Telephone Encounter (Signed)
Patient took valsartan in the morning became very lightheaded, could barely stand, heart was racing, and stated it felt just like vertigo. Patient felt so bad she could not go to work and wants to know if Dr. Darl Householder will give her a note for today. Patient then took it at night the next day and was up all night long, could not sleep and having cold sweats. Patient stated she felt like she had fever but didn't. Very nauseated and all symptoms are coming in waves. Patient was taking guanafacine for 3 years and if she missed one dose of this in the past and had these symptoms. Has not been able to check blood pressure, but stated she felt like it was going up and down. Wants to know if she should go back on other medicine. Routed to Dr. Darl Householder. If patient doesn't answer home number call cell [phone]

## 2016-11-01 NOTE — Telephone Encounter (Signed)
[name] called stating that since taking valsartan (DIOVAN) 80 MG tablet  She is not feeling right.  She is dizzy, nausea, states "I don't feel right"   [phone]

## 2016-11-01 NOTE — Telephone Encounter (Signed)
Advised patient of Dr. Orson Gear recommendation. Patient wanted to be wrote out of work for today. Advised patient since Dr. Darl Householder is out of the office she should see her PCP for the symptoms and a work note. Patient verbalized understanding. Routed to Dr. Darl Householder.

## 2016-11-01 NOTE — Telephone Encounter (Signed)
Patient of Dr. Purvis Sheffield - out of the office this afternoon. I reviewed his recent note from March 26 with recommended change from guanfacine to Diovan. It is very difficult to know whether the symptoms she is feeling are related to this switch in medication, but if she feels as though she has had similar symptoms when she missed guanfacine in the past, suggest that she go back on that medication instead of Diovan for the time being and then await further instructions from Dr. Purvis Sheffield regarding the next step. It would be a good idea for her to continue to track blood pressure at home however in the meanwhile. If the symptoms persist despite switching back on medication, she should be seen for evaluation.

## 2016-11-06 MED ORDER — HYDRALAZINE HCL 25 MG PO TABS: 25.0000 mg | ORAL_TABLET | Freq: Three times a day (TID) | ORAL | 3 refills | Status: DC

## 2016-11-06 NOTE — Telephone Encounter (Signed)
Try hydralazine 25 mg tid instead of Diovan, although difficult to know if this med actually caused these symptoms. She should be checking BP daily.

## 2016-11-06 NOTE — Telephone Encounter (Signed)
Patient notified. Medication sent to [facility]

## 2016-11-19 ENCOUNTER — Other Ambulatory Visit: Payer: Self-pay

## 2016-11-19 MED ORDER — AMLODIPINE BESYLATE 10 MG PO TABS: 10.0000 mg | ORAL_TABLET | Freq: Every day | ORAL | 6 refills | Status: DC

## 2016-11-19 MED ORDER — METOPROLOL SUCCINATE ER 50 MG PO TB24: 50.0000 mg | ORAL_TABLET | Freq: Every day | ORAL | 6 refills | Status: DC

## 2016-11-29 ENCOUNTER — Other Ambulatory Visit: Payer: Self-pay

## 2016-11-29 MED ORDER — AMLODIPINE BESYLATE 10 MG PO TABS: 10.0000 mg | ORAL_TABLET | Freq: Every day | ORAL | 3 refills | Status: DC

## 2016-11-29 MED ORDER — METOPROLOL SUCCINATE ER 50 MG PO TB24: 50.0000 mg | ORAL_TABLET | Freq: Every day | ORAL | 3 refills | Status: DC

## 2016-12-27 ENCOUNTER — Other Ambulatory Visit: Payer: Self-pay | Admitting: Cardiovascular Disease

## 2017-03-29 ENCOUNTER — Emergency Department (HOSPITAL_COMMUNITY): Payer: BLUE CROSS/BLUE SHIELD | Admitting: Anesthesiology

## 2017-03-29 ENCOUNTER — Emergency Department (HOSPITAL_COMMUNITY)
Admission: EM | Admit: 2017-03-29 | Discharge: 2017-03-29 | Disposition: A | Discharge status: Home / Self Care | Payer: BLUE CROSS/BLUE SHIELD | Attending: Emergency Medicine | Admitting: Emergency Medicine

## 2017-03-29 ENCOUNTER — Encounter (HOSPITAL_COMMUNITY)
Admission: EM | Disposition: A | Discharge status: Home / Self Care | Payer: Self-pay | Source: Home / Self Care | Attending: Emergency Medicine

## 2017-03-29 ENCOUNTER — Emergency Department (HOSPITAL_COMMUNITY): Payer: BLUE CROSS/BLUE SHIELD

## 2017-03-29 ENCOUNTER — Encounter (HOSPITAL_COMMUNITY): Payer: Self-pay | Admitting: *Deleted

## 2017-03-29 DIAGNOSIS — N200 Calculus of kidney: Secondary | ICD-10-CM

## 2017-03-29 DIAGNOSIS — I110 Hypertensive heart disease with heart failure: Secondary | ICD-10-CM | POA: Diagnosis not present

## 2017-03-29 DIAGNOSIS — N132 Hydronephrosis with renal and ureteral calculous obstruction: Secondary | ICD-10-CM | POA: Diagnosis not present

## 2017-03-29 DIAGNOSIS — E669 Obesity, unspecified: Secondary | ICD-10-CM | POA: Insufficient documentation

## 2017-03-29 DIAGNOSIS — I071 Rheumatic tricuspid insufficiency: Secondary | ICD-10-CM | POA: Diagnosis not present

## 2017-03-29 DIAGNOSIS — I5032 Chronic diastolic (congestive) heart failure: Secondary | ICD-10-CM | POA: Diagnosis not present

## 2017-03-29 DIAGNOSIS — N201 Calculus of ureter: Secondary | ICD-10-CM

## 2017-03-29 DIAGNOSIS — Z79899 Other long term (current) drug therapy: Secondary | ICD-10-CM | POA: Diagnosis not present

## 2017-03-29 HISTORY — PX: CYSTOSCOPY/URETEROSCOPY/HOLMIUM LASER/STENT PLACEMENT: SHX6546

## 2017-03-29 SURGERY — CYSTOSCOPY/URETEROSCOPY/HOLMIUM LASER/STENT PLACEMENT
Anesthesia: General | Laterality: Right

## 2017-03-29 SURGERY — Surgical Case
Anesthesia: *Unknown

## 2017-03-29 MED ORDER — FENTANYL CITRATE (PF) 100 MCG/2ML IJ SOLN
INTRAMUSCULAR | Status: AC
  Filled 2017-03-29: qty 2

## 2017-03-29 MED ORDER — SENNOSIDES-DOCUSATE SODIUM 8.6-50 MG PO TABS: 1.0000 | ORAL_TABLET | Freq: Two times a day (BID) | ORAL | 0 refills | Status: DC

## 2017-03-29 MED ORDER — DEXAMETHASONE SODIUM PHOSPHATE 10 MG/ML IJ SOLN
INTRAMUSCULAR | Status: AC
  Filled 2017-03-29: qty 1

## 2017-03-29 MED ORDER — PROMETHAZINE HCL 25 MG/ML IJ SOLN: 6.2500 mg | INTRAMUSCULAR | Status: DC | PRN

## 2017-03-29 MED ORDER — SODIUM CHLORIDE 0.9 % IV SOLN
INTRAVENOUS | Status: DC | PRN
  Administered 2017-03-29: 20 mL

## 2017-03-29 MED ORDER — CIPROFLOXACIN HCL 500 MG PO TABS: 500.0000 mg | ORAL_TABLET | Freq: Two times a day (BID) | ORAL | 0 refills | Status: AC

## 2017-03-29 MED ORDER — SUGAMMADEX SODIUM 200 MG/2ML IV SOLN
INTRAVENOUS | Status: DC | PRN
  Administered 2017-03-29: 400 mg via INTRAVENOUS

## 2017-03-29 MED ORDER — DEXAMETHASONE SODIUM PHOSPHATE 4 MG/ML IJ SOLN
INTRAMUSCULAR | Status: DC | PRN
  Administered 2017-03-29: 10 mg via INTRAVENOUS

## 2017-03-29 MED ORDER — SUCCINYLCHOLINE CHLORIDE 20 MG/ML IJ SOLN
INTRAMUSCULAR | Status: DC | PRN
  Administered 2017-03-29: 100 mg via INTRAVENOUS

## 2017-03-29 MED ORDER — HYDROMORPHONE HCL-NACL 0.5-0.9 MG/ML-% IV SOSY: 0.2500 mg | PREFILLED_SYRINGE | INTRAVENOUS | Status: DC | PRN

## 2017-03-29 MED ORDER — FENTANYL CITRATE (PF) 100 MCG/2ML IJ SOLN
INTRAMUSCULAR | Status: DC | PRN
  Administered 2017-03-29 (×2): 50 ug via INTRAVENOUS

## 2017-03-29 MED ORDER — LACTATED RINGERS IV SOLN
INTRAVENOUS | Status: DC | PRN
  Administered 2017-03-29: 19:00:00 via INTRAVENOUS

## 2017-03-29 MED ORDER — LIDOCAINE HCL (CARDIAC) 20 MG/ML IV SOLN
INTRAVENOUS | Status: DC | PRN
  Administered 2017-03-29: 100 mg via INTRAVENOUS

## 2017-03-29 MED ORDER — ROCURONIUM BROMIDE 100 MG/10ML IV SOLN
INTRAVENOUS | Status: DC | PRN
  Administered 2017-03-29: 30 mg via INTRAVENOUS

## 2017-03-29 MED ORDER — LIDOCAINE 2% (20 MG/ML) 5 ML SYRINGE
INTRAMUSCULAR | Status: AC
  Filled 2017-03-29: qty 5

## 2017-03-29 MED ORDER — PROPOFOL 10 MG/ML IV BOLUS
INTRAVENOUS | Status: AC
  Filled 2017-03-29: qty 20

## 2017-03-29 MED ORDER — KETOROLAC TROMETHAMINE 10 MG PO TABS: 10.0000 mg | ORAL_TABLET | Freq: Four times a day (QID) | ORAL | 1 refills | Status: DC | PRN

## 2017-03-29 MED ORDER — PROPOFOL 10 MG/ML IV BOLUS
INTRAVENOUS | Status: DC | PRN
  Administered 2017-03-29: 200 mg via INTRAVENOUS

## 2017-03-29 MED ORDER — ONDANSETRON HCL 4 MG/2ML IJ SOLN
INTRAMUSCULAR | Status: DC | PRN
  Administered 2017-03-29: 4 mg via INTRAVENOUS

## 2017-03-29 MED ORDER — OXYCODONE-ACETAMINOPHEN 5-325 MG PO TABS: 1.0000 | ORAL_TABLET | ORAL | 0 refills | Status: DC | PRN

## 2017-03-29 MED ORDER — CIPROFLOXACIN IN D5W 400 MG/200ML IV SOLN
400.0000 mg | Freq: Once | INTRAVENOUS | Status: AC
  Administered 2017-03-29: 400 mg via INTRAVENOUS
  Filled 2017-03-29: qty 200

## 2017-03-29 MED ORDER — ONDANSETRON HCL 4 MG/2ML IJ SOLN
INTRAMUSCULAR | Status: AC
  Filled 2017-03-29: qty 2

## 2017-03-29 MED ORDER — DIPHENHYDRAMINE HCL 50 MG/ML IJ SOLN
INTRAMUSCULAR | Status: DC | PRN
  Administered 2017-03-29: 25 mg via INTRAVENOUS

## 2017-03-29 MED ORDER — DIPHENHYDRAMINE HCL 50 MG/ML IJ SOLN
INTRAMUSCULAR | Status: AC
  Filled 2017-03-29: qty 1

## 2017-03-29 MED ORDER — SODIUM CHLORIDE 0.9 % IR SOLN
Status: DC | PRN
  Administered 2017-03-29: 1000 mL via INTRAVESICAL
  Administered 2017-03-29: 3000 mL via INTRAVESICAL

## 2017-03-29 MED ORDER — ROCURONIUM BROMIDE 50 MG/5ML IV SOSY
PREFILLED_SYRINGE | INTRAVENOUS | Status: AC
  Filled 2017-03-29: qty 5

## 2017-03-29 MED ORDER — SUGAMMADEX SODIUM 500 MG/5ML IV SOLN
INTRAVENOUS | Status: AC
  Filled 2017-03-29: qty 5

## 2017-03-29 SURGICAL SUPPLY — 26 items
BAG URO CATCHER STRL LF (MISCELLANEOUS) ×3 IMPLANT
BASKET LASER NITINOL 1.9FR (BASKET) IMPLANT
CATH INTERMIT  6FR 70CM (CATHETERS) ×3 IMPLANT
CLOTH BEACON ORANGE TIMEOUT ST (SAFETY) ×3 IMPLANT
COVER FOOT SWITCH UNIV DISP (DRAPES) IMPLANT
COVER SURGICAL LIGHT HANDLE (MISCELLANEOUS) ×3 IMPLANT
FIBER LASER FLEXIVA 1000 (UROLOGICAL SUPPLIES) IMPLANT
FIBER LASER FLEXIVA 365 (UROLOGICAL SUPPLIES) IMPLANT
FIBER LASER FLEXIVA 550 (UROLOGICAL SUPPLIES) IMPLANT
FIBER LASER TRAC TIP (UROLOGICAL SUPPLIES) ×3 IMPLANT
GLOVE BIOGEL M STRL SZ7.5 (GLOVE) ×3 IMPLANT
GOWN STRL REUS W/TWL LRG LVL3 (GOWN DISPOSABLE) ×6 IMPLANT
GUIDEWIRE ANG ZIPWIRE 038X150 (WIRE) ×3 IMPLANT
GUIDEWIRE STR DUAL SENSOR (WIRE) ×3 IMPLANT
IV NS 1000ML (IV SOLUTION) ×2
IV NS 1000ML BAXH (IV SOLUTION) ×1 IMPLANT
MANIFOLD NEPTUNE II (INSTRUMENTS) ×3 IMPLANT
PACK CYSTO (CUSTOM PROCEDURE TRAY) ×3 IMPLANT
SHEATH ACCESS URETERAL 24CM (SHEATH) IMPLANT
SHEATH ACCESS URETERAL 38CM (SHEATH) IMPLANT
SHEATH ACCESS URETERAL 54CM (SHEATH) IMPLANT
STENT POLARIS 5FRX24 (STENTS) ×3 IMPLANT
SYR CONTROL 10ML LL (SYRINGE) ×3 IMPLANT
TUBE FEEDING 8FR 16IN STR KANG (MISCELLANEOUS) ×3 IMPLANT
TUBING CONNECTING 10 (TUBING) ×2 IMPLANT
TUBING CONNECTING 10' (TUBING) ×1

## 2017-03-29 NOTE — Plan of Care (Signed)
Referring records show Rocephin given at [facility] in [place] but allergy list shows PCN allergy. Will also give cipro 400mg  IV as surgical prophylaxis to verify current on peri-op dose.

## 2017-03-29 NOTE — Interval H&P Note (Signed)
History and Physical Interval Note:  03/29/2017 6:41 PM  [name]  has presented today for surgery, with the diagnosis of Right ureteral stone  The various methods of treatment have been discussed with the patient and family. After consideration of risks, benefits and other options for treatment, the patient has consented to  Procedure(s): CYSTOSCOPY/URETEROSCOPY/HOLMIUM LASER/STENT PLACEMENT (Right) as a surgical intervention .  The patient's history has been reviewed, patient examined, no change in status, stable for surgery.  I have reviewed the patient's chart and labs.  Questions were answered to the patient's satisfaction.     [name]

## 2017-03-29 NOTE — ED Provider Notes (Signed)
[facility]-EMERGENCY DEPT Provider Note   CSN: [id number] Arrival date & time: 03/29/17  1715     History   Chief Complaint Chief Complaint  Patient presents with  . Flank Pain    Kidney stone    HPI [name] is a 41 y.o. female.  HPI Patient sent from [facility] with right-sided kidney stone. Had acute onset of pain earlier today. Severe. Reportedly was ill-appearing up there. Now feels much better. Had transferred down to see Dr. Berneice Heinrich from urology.no fevers. No blood in the urine. Past Medical History:  Diagnosis Date  . Anxiety   . Burning with urination   . Chronic diastolic CHF (congestive heart failure) (HCC)   . Constipation   . Coronary artery fistula    ??? by echo 08/2009../..cardiac CTA...09/05/2009...no coronary fistula  . Dizziness    Nonspecific, May, 2012, feeling off balance  . Ejection fraction < 50%    EF 35-40%...echo...january 27,2011/cardiac cta EF 51%...09/05/2009 LVH..moderate...echo january 2011  . Fluid overload    April, 2012  . Hypertension    severe,but very responsive to amlodipine and hydrochlorithizide  overweight  . LVH (left ventricular hypertrophy)    Moderate echo, January, 2011  . Migraine   . Obesity   . Palpitation   . Pulmonary hypertension (HCC)    60 mmHg echo January, 2011 / normal pulmonary arteries by CT angiography  . Shortness of breath   . Tricuspid regurgitation    moderate ...echo..january 2011  . UTI (urinary tract infection)    recurrent  . Weight gain    August, 2012    Patient Active Problem List   Diagnosis Date Noted  . Neck discomfort 01/07/2014  . Burning with urination   . Constipation   . Chronic diastolic CHF (congestive heart failure) (HCC)   . Weight gain   . Dizziness   . Fluid overload   . Obesity   . Palpitation   . Ejection fraction < 50%   . Tricuspid regurgitation   . Hypertension   . Shortness of breath   . Coronary artery fistula   . UTI (urinary tract infection)   . LVH (left  ventricular hypertrophy)   . Pulmonary hypertension (HCC)     Past Surgical History:  Procedure Laterality Date  . CESAREAN SECTION    . TUBAL LIGATION      OB History    No data available       Home Medications    Prior to Admission medications   Medication Sig Start Date End Date Taking? Authorizing Provider  amLODipine (NORVASC) 10 MG tablet Take 1 tablet (10 mg total) by mouth daily. 11/29/16   Laqueta Linden, MD  ciprofloxacin (CIPRO) 500 MG tablet Take 1 tablet (500 mg total) by mouth 2 (two) times daily. X 3 days to prevent infection with tethered stent in place. 03/29/17 04/01/17  Sebastian Ache, MD  furosemide (LASIX) 20 MG tablet Take 1 tablet (20 mg total) by mouth as needed for edema (swelling). 04/19/16   Laqueta Linden, MD  hydrALAZINE (APRESOLINE) 25 MG tablet Take 1 tablet (25 mg total) by mouth 3 (three) times daily. 11/06/16 02/04/17  Laqueta Linden, MD  ketorolac (TORADOL) 10 MG tablet Take 1 tablet (10 mg total) by mouth every 6 (six) hours as needed for moderate pain. Or stent discomfort post-operatively. (pt has tolerated this med prior) 03/29/17   Sebastian Ache, MD  metoprolol succinate (TOPROL-XL) 50 MG 24 hr tablet Take 1 tablet (50 mg  total) by mouth daily. Take with or immediately following a meal. 11/29/16   Laqueta Linden, MD  omeprazole (PRILOSEC) 20 MG capsule Take 20 mg by mouth daily as needed. 10/13/16   [provider]  oxyCODONE-acetaminophen (ROXICET) 5-325 MG tablet Take 1-2 tablets by mouth every 4 (four) hours as needed for severe pain. Post-operatively. 03/29/17   Sebastian Ache, MD  senna-docusate (SENOKOT-S) 8.6-50 MG tablet Take 1 tablet by mouth 2 (two) times daily. While taking strongest pain med to prevent constipation. 03/29/17   Sebastian Ache, MD  valsartan (DIOVAN) 80 MG tablet Take 1 tablet (80 mg total) by mouth daily. 10/28/16   Laqueta Linden, MD    Family History Family History  Problem Relation  Age of Onset  . Cancer Other     Social History Social History  Substance Use Topics  . Smoking status: Never Smoker  . Smokeless tobacco: Never Used  . Alcohol use No     Allergies   Penicillins and Sulfonamide derivatives   Review of Systems Review of Systems  Constitutional: Negative for chills.  Cardiovascular: Negative for chest pain.  Gastrointestinal: Negative for abdominal pain.  Genitourinary: Positive for flank pain. Negative for hematuria.  Skin: Negative for rash.     Physical Exam Updated Vital Signs BP (!) 140/100   Pulse 91   Temp 99 F (37.2 C)   Resp 16   LMP 03/22/2017 Comment: tubes tied per patient  SpO2 96%   Physical Exam  Constitutional: She appears well-developed.  HENT:  Head: Atraumatic.  Cardiovascular: Normal rate.   Pulmonary/Chest: She exhibits no tenderness.  Abdominal: There is no tenderness.  Musculoskeletal: She exhibits no edema.  Neurological: She is alert.  Skin: Capillary refill takes less than 2 seconds.     ED Treatments / Results  Labs (all labs ordered are listed, but only abnormal results are displayed) Labs Reviewed - No data to display  EKG  EKG Interpretation None       Radiology Dg C-arm 1-60 Min-no Report  Result Date: 03/29/2017 Fluoroscopy was utilized by the requesting physician.  No radiographic interpretation.    Procedures Procedures (including critical care time)  Medications Ordered in ED Medications  HYDROmorphone (DILAUDID) injection 0.25-0.5 mg (not administered)  promethazine (PHENERGAN) injection 6.25-12.5 mg (not administered)  ciprofloxacin (CIPRO) IVPB 400 mg (400 mg Intravenous Given 03/29/17 1936)     Initial Impression / Assessment and Plan / ED Course  I have reviewed the triage vital signs and the nursing notes.  Pertinent labs & imaging results that were available during my care of the patient were reviewed by me and considered in my medical decision making (see chart  for details).     Patient withflank pain. Now resolved. Had kidney stones. Seen by urology in the ER.and taken for definitive treatment  Final Clinical Impressions(s) / ED Diagnoses   Final diagnoses:  Kidney stone    New Prescriptions Discharge Medication List as of 03/29/2017  8:16 PM    START taking these medications   Details  ciprofloxacin (CIPRO) 500 MG tablet Take 1 tablet (500 mg total) by mouth 2 (two) times daily. X 3 days to prevent infection with tethered stent in place., Starting Sat 03/29/2017, Until Tue 04/01/2017, Print    ketorolac (TORADOL) 10 MG tablet Take 1 tablet (10 mg total) by mouth every 6 (six) hours as needed for moderate pain. Or stent discomfort post-operatively. (pt has tolerated this med prior), Starting Sat 03/29/2017, Print  oxyCODONE-acetaminophen (ROXICET) 5-325 MG tablet Take 1-2 tablets by mouth every 4 (four) hours as needed for severe pain. Post-operatively., Starting Sat 03/29/2017, Print    senna-docusate (SENOKOT-S) 8.6-50 MG tablet Take 1 tablet by mouth 2 (two) times daily. While taking strongest pain med to prevent constipation., Starting Sat 03/29/2017, Print         Benjiman Core, MD 03/30/17 [phone]

## 2017-03-29 NOTE — Discharge Instructions (Signed)
1 - You may have urinary urgency (bladder spasms) and bloody urine on / off with stent in place. This is normal.  2 - Remove tethered stent on Monday morning at home by pulling on string, then blue-white plastic tubing and discarding. Office is open Monday if any issues arise.   3 - Call MD or go to ER for fever >102, severe pain / nausea / vomiting not relieved by medications, or acute change in medical status  

## 2017-03-29 NOTE — ED Notes (Signed)
Urology in to assess pt, pt placed on OR list for stint placement

## 2017-03-29 NOTE — H&P (Signed)
[name] is an 41 y.o. female.    Chief Complaint: Right Ureteral Stone  HPI:  1 - RIGHT Ureteral Stone - 6mm Rt UVJ stone with moderate hydro by CT at [facility] in [place]. Stone is solitary, 6mm, 650HU, SSD 15cm. UA without infectious parameters. Cr <1. No fevers, leukocytosis. Her colic refracotry per report therefore trannfered to [facility] ER. NO prior stones.   She is s/p BTL, last meal 6AM.   PMH sig for obesity, BTL, HTN. No ischemic CV disease / blood thinners. Her PCP is Dr. Willaim [name] in [place].   Today "[name]" is seen for evaluation of above. She is s/p ambulance transfer to [facility] where her pain at present is improved but was refractory earlier today.   Past Medical History:  Diagnosis Date  . Anxiety   . Burning with urination   . Chronic diastolic CHF (congestive heart failure) (HCC)   . Constipation   . Coronary artery fistula    ??? by echo 08/2009../..cardiac CTA...09/05/2009...no coronary fistula  . Dizziness    Nonspecific, May, 2012, feeling off balance  . Ejection fraction < 50%    EF 35-40%...echo...january 27,2011/cardiac cta EF 51%...09/05/2009 LVH..moderate...echo january 2011  . Fluid overload    April, 2012  . Hypertension    severe,but very responsive to amlodipine and hydrochlorithizide  overweight  . LVH (left ventricular hypertrophy)    Moderate echo, January, 2011  . Migraine   . Obesity   . Palpitation   . Pulmonary hypertension (HCC)    60 mmHg echo January, 2011 / normal pulmonary arteries by CT angiography  . Shortness of breath   . Tricuspid regurgitation    moderate ...echo..january 2011  . UTI (urinary tract infection)    recurrent  . Weight gain    August, 2012    Past Surgical History:  Procedure Laterality Date  . CESAREAN SECTION    . TUBAL LIGATION      Family History  Problem Relation Age of Onset  . Cancer Other    Social History:  reports that she has never smoked. She has never used smokeless tobacco.  She reports that she does not drink alcohol or use drugs.  Allergies:  Allergies  Allergen Reactions  . Penicillins   . Sulfonamide Derivatives      (Not in a hospital admission)  No results found for this or any previous visit (from the past 48 hour(s)). No results found.  Review of Systems  Constitutional: Negative for chills, fever and malaise/fatigue.  HENT: Negative.   Eyes: Negative.   Respiratory: Negative.   Cardiovascular: Negative.   Gastrointestinal: Negative.   Genitourinary: Positive for flank pain.  Skin: Negative.   Neurological: Negative.   Endo/Heme/Allergies: Negative.   Psychiatric/Behavioral: Negative.     Blood pressure (!) 158/97, pulse 86, temperature 98.1 F (36.7 C), temperature source Oral, resp. rate 18, SpO2 100 %. Physical Exam  Constitutional: She is oriented to person, place, and time. She appears well-developed.  HENT:  Head: Normocephalic.  Eyes: Pupils are equal, round, and reactive to light.  Neck: Normal range of motion.  Cardiovascular: Normal rate.   Respiratory: Effort normal.  GI: Soft.  Large truncal obesity.   Genitourinary:  Genitourinary Comments: Moderate Rt CVAT at present.   Musculoskeletal: Normal range of motion.  Neurological: She is alert and oriented to person, place, and time.  Skin: Skin is warm.  Psychiatric: She has a normal mood and affect.     Assessment/Plan  1 - RIGHT Ureteral Stone - options of medical therapy, SWL, ureteroscopy discussed. She adamantly wants to proceed with ureteroscopy with goal of stone free. Risks, benefits, alternatives, expected peri-op course, need for likely temporary post-op stent discussed.   Sebastian Ache, MD 03/29/2017, 5:56 PM

## 2017-03-29 NOTE — ED Notes (Signed)
Bed: LT90 Expected date:  Expected time:  Means of arrival:  Comments: Transfer from [facility]- 28mm stone

## 2017-03-29 NOTE — Anesthesia Preprocedure Evaluation (Addendum)
Anesthesia Evaluation  Patient identified by MRN, date of birth, ID band  Reviewed: Allergy & Precautions, NPO status , Patient's Chart, lab work & pertinent test results  History of Anesthesia Complications Negative for: history of anesthetic complications  Airway Mallampati: II  TM Distance: >3 FB Neck ROM: Full    Dental no notable dental hx. (+) Dental Advisory Given   Pulmonary neg pulmonary ROS,    Pulmonary exam normal        Cardiovascular hypertension, Pt. on medications and Pt. on home beta blockers Normal cardiovascular exam  Study Conclusions  - Left ventricle: The cavity size was normal. Wall thickness was increased increased in a pattern of mild to moderate LVH. Systolic function was vigorous. The estimated ejection fraction was in the range of 65% to 70%. Wall motion was normal; there were no regional wall motion abnormalities.   Neuro/Psych  Headaches, PSYCHIATRIC DISORDERS Anxiety    GI/Hepatic negative GI ROS, Neg liver ROS,   Endo/Other  negative endocrine ROS  Renal/GU      Musculoskeletal   Abdominal   Peds  Hematology   Anesthesia Other Findings   Reproductive/Obstetrics                           Anesthesia Physical Anesthesia Plan  ASA: II  Anesthesia Plan: General   Post-op Pain Management:    Induction: Intravenous  PONV Risk Score and Plan: 3 and Ondansetron, Dexamethasone and Diphenhydramine  Airway Management Planned: LMA and Oral ETT  Additional Equipment:   Intra-op Plan:   Post-operative Plan: Extubation in OR  Informed Consent: I have reviewed the patients History and Physical, chart, labs and discussed the procedure including the risks, benefits and alternatives for the proposed anesthesia with the patient or authorized representative who has indicated his/her understanding and acceptance.   Dental advisory given  Plan Discussed with:  Anesthesiologist, CRNA and Surgeon  Anesthesia Plan Comments:        Anesthesia Quick Evaluation

## 2017-03-29 NOTE — Anesthesia Procedure Notes (Addendum)
Procedure Name: Intubation Date/Time: 03/29/2017 7:23 PM Performed by: Vanessa [place] Pre-anesthesia Checklist: Patient identified, Emergency Drugs available, Suction available and Patient being monitored Patient Re-evaluated:Patient Re-evaluated prior to induction Oxygen Delivery Method: Circle system utilized Preoxygenation: Pre-oxygenation with 100% oxygen Induction Type: IV induction, Rapid sequence and Cricoid Pressure applied Ventilation: Mask ventilation without difficulty Laryngoscope Size: 2 and Miller Grade View: Grade I Tube type: Oral Tube size: 7.0 mm Number of attempts: 1 Airway Equipment and Method: Bougie stylet Placement Confirmation: ETT inserted through vocal cords under direct vision,  positive ETCO2 and breath sounds checked- equal and bilateral Secured at: 22 cm Tube secured with: Tape Dental Injury: Teeth and Oropharynx as per pre-operative assessment

## 2017-03-29 NOTE — Transfer of Care (Signed)
Immediate Anesthesia Transfer of Care Note  Patient: [name]  Procedure(s) Performed: Procedure(s): CYSTOSCOPY/URETEROSCOPY/HOLMIUM LASER/STENT PLACEMENT (Right)  Patient Location: PACU  Anesthesia Type:General  Level of Consciousness:  sedated, patient cooperative and responds to stimulation  Airway & Oxygen Therapy:Patient Spontanous Breathing and Patient connected to face mask oxgen  Post-op Assessment:  Report given to PACU RN and Post -op Vital signs reviewed and stable  Post vital signs:  Reviewed and stable  Last Vitals:  Vitals:   03/29/17 1721  BP: (!) 158/97  Pulse: 86  Resp: 18  Temp: 36.7 C  SpO2: 100%    Complications: No apparent anesthesia complications

## 2017-03-29 NOTE — Brief Op Note (Signed)
03/29/2017  7:44 PM  PATIENT:  [name]  41 y.o. female  PRE-OPERATIVE DIAGNOSIS:  Right ureteral stone  POST-OPERATIVE DIAGNOSIS:  * No post-op diagnosis entered *  PROCEDURE:  Procedure(s): CYSTOSCOPY/URETEROSCOPY/HOLMIUM LASER/STENT PLACEMENT (Right)  SURGEON:  Surgeon(s) and Role:    Sebastian Ache, MD - Primary  PHYSICIAN ASSISTANT:   ASSISTANTS: none   ANESTHESIA:   general  EBL:  No intake/output data recorded.  BLOOD ADMINISTERED:none  DRAINS: none   LOCAL MEDICATIONS USED:  NONE  SPECIMEN:  Source of Specimen:  Rt ureteral stone fragments  DISPOSITION OF SPECIMEN:  [facility] for compositional analysis  COUNTS:  YES  TOURNIQUET:  * No tourniquets in log *  DICTATION: .Other Dictation: Dictation Number [id number]  PLAN OF CARE: Discharge to home after PACU  PATIENT DISPOSITION:  PACU - hemodynamically stable.   Delay start of Pharmacological VTE agent (>24hrs) due to surgical blood loss or risk of bleeding: yes

## 2017-03-29 NOTE — ED Triage Notes (Signed)
Transferred by EMS for Urology to care for due to 58mm Kidney stone, Pt has been given Toradol which has relieved her pain,

## 2017-03-30 NOTE — Anesthesia Postprocedure Evaluation (Signed)
Anesthesia Post Note  Patient: [name]  Procedure(s) Performed: Procedure(s) (LRB): CYSTOSCOPY/URETEROSCOPY/HOLMIUM LASER/STENT PLACEMENT (Right)     Patient location during evaluation: PACU Anesthesia Type: General Level of consciousness: sedated Pain management: pain level controlled Vital Signs Assessment: post-procedure vital signs reviewed and stable Respiratory status: spontaneous breathing and respiratory function stable Cardiovascular status: stable Anesthetic complications: no    Last Vitals:  Vitals:   03/29/17 2100 03/29/17 2110  BP: (!) 140/100 (!) 140/100  Pulse: 88 91  Resp: 16   Temp: 37.2 C   SpO2: 98% 96%    Last Pain:  Vitals:   03/29/17 1721  TempSrc: Oral  PainSc:                  [name]

## 2017-03-30 NOTE — Op Note (Signed)
NAME:  [name], [name]                     ACCOUNT NO.:  MEDICAL RECORD NO.:  [id number]  LOCATION:                                 FACILITY:  PHYSICIAN:  Sebastian Ache, MD          DATE OF BIRTH:  DATE OF PROCEDURE: 03/29/2017                              OPERATIVE REPORT   PREOPERATIVE DIAGNOSES:  Right distal ureteral stone with refractory colic.  PROCEDURE: 1. Cystoscopy with right retrograde pyelogram interpretation. 2. Right ureteroscopy with laser lithotripsy. 3. Insertion of right ureteral stent, 5 x 24 Polaris with tether.  ESTIMATED BLOOD LOSS:  Nil.  COMPLICATIONS:  None.  SPECIMEN:  Right distal ureteral stone fragments to compositional analysis.  FINDINGS: 1. Moderate hydroureteronephrosis. 2. Distal ureteral stone on the right. 3. Successful placement of right ureteral stent, proximal in renal     pelvis and distal in urinary bladder. 4. Complete resolution of all stone fragments larger than 1/3rd mm     following laser lithotripsy and basket extraction from the right     ureter.  INDICATIONS:  [name] is a pleasant 41 year old woman with history of obesity and hypertension who was found on workup of colicky flank pain to have a right distal ureteral stone at [facility] in [place].  They have no urology coverage there.  Her pain was reportedly refractory.  I discussed this with the ER physician there, and they desired transfer which we accepted.  The patient was seen and evaluated in our emergency room here.  Her imaging was reviewed, and she indeed had a right distal ureteral stone.  No infectious parameters.  Her colic was severe.  Options were discussed including medical therapy versus ureteroscopy versus shockwave lithotripsy.  She wished to proceed with ureteroscopy today.  Informed consent was obtained and placed in the medical record.  PROCEDURE IN DETAIL:  The patient being, [name] verified, right ureteroscopic stone manipulation  was confirmed.  Procedure was carried out.  Time-out was performed.  Intravenous antibiotics were administered.  General anesthesia was introduced.  The patient was placed into a low lithotomy position.  Sterile field was created by prepping and draping the patient's vagina, introitus, and proximal thighs using iodine.  Next, cystourethroscopy was performed using a rigid cystoscope.  Inspection of the urinary bladder revealed no diverticula, calcifications, or papillary lesions.  The right ureteral orifice was cannulated with a 6-French end-hole catheter, and right retrograde pyelogram was obtained.  Right retrograde pyelogram demonstrated a single right ureter with single-system right kidney.  There was a mobile filling defect in distal ureter consistent with known stone.  There was moderate hydroureteronephrosis above this.  A 0.038 ZIPwire was advanced to the level of the upper pole, set aside as a safety wire.  An 8-French feeding tube was placed in the bladder for pressure release.  Next, semi- rigid ureteroscopy was performed on the distal right ureter alongside a separate Sensor working wire.  The stone in question was found just above the level of the intramural ureter.  It appeared to be too large for simple basketing.  As such, holmium laser energy applied to the stone using  settings of 0.3 joules and 20 Hz, and this fragmented into 3 smaller pieces, which were all quite elongated.  These were then sequentially grasped on the long axis and escape basket and removed and set aside for compositional analysis.  The entire right ureter was then inspected again using a semi-rigid ureteroscopy, and there was complete resolution of all stone fragments larger than 1/3rd mm.  Given impacted stone refractory colic, it was felt that brief interval stenting would be warranted.  As such, a new 5 x 24 Polaris-type stent was placed over remaining safety wire using fluoroscopic guidance.  Good  proximal and distal deployment were noted.  A tether was left in place and fashioned to the mons pubis, and the procedure was terminated.  The patient tolerated the procedure well.  No immediate periprocedural complications.  The patient was taken to the postanesthesia care in stable condition.          ______________________________ Sebastian Ache, MD     TM/MEDQ  D:  03/29/2017  T:  03/29/2017  Job:  [id number]

## 2017-03-31 ENCOUNTER — Encounter (HOSPITAL_COMMUNITY): Payer: Self-pay | Admitting: Urology

## 2017-04-01 ENCOUNTER — Encounter (HOSPITAL_COMMUNITY): Payer: Self-pay | Admitting: Urology

## 2017-04-25 ENCOUNTER — Ambulatory Visit (INDEPENDENT_AMBULATORY_CARE_PROVIDER_SITE_OTHER): Payer: BLUE CROSS/BLUE SHIELD | Admitting: Cardiovascular Disease

## 2017-04-25 ENCOUNTER — Encounter: Payer: Self-pay | Admitting: Cardiovascular Disease

## 2017-04-25 VITALS — BP 140/100 | HR 74 | Ht 63.0 in | Wt 239.0 lb

## 2017-04-25 DIAGNOSIS — I5032 Chronic diastolic (congestive) heart failure: Secondary | ICD-10-CM

## 2017-04-25 DIAGNOSIS — I10 Essential (primary) hypertension: Secondary | ICD-10-CM | POA: Diagnosis not present

## 2017-04-25 DIAGNOSIS — I119 Hypertensive heart disease without heart failure: Secondary | ICD-10-CM

## 2017-04-25 MED ORDER — LOSARTAN POTASSIUM 25 MG PO TABS: 25.0000 mg | ORAL_TABLET | Freq: Every day | ORAL | 1 refills | Status: DC

## 2017-04-25 NOTE — Progress Notes (Signed)
SUBJECTIVE: The patientpresents for routine follow-up. She hasa history of hypertensive heart disease with chronic diastolic heart failure. She developed a hypertensive cardiomyopathy but with good blood pressure control LV systolic function normalized.  Echocardiogram on 03/23/14 demonstrated vigorous left ventricular systolic function and normal regional wall motion, LVEF 65-70%, grade 2 diastolic dysfunction with increased filling pressures, mild to moderate left atrial dilatation, and mild right atrial dilatation.  She underwent unremarkable Holter monitoring in June 2016.  She has had a difficult year. Her mother passed away this year of leukemia. She has put on a lot of weight due to this stress.   She recently had a kidney stone which had to be removed.   She said her blood pressure and her PCPs office was 145/90. She started taking guanfacine again.  Review of Systems: As per "subjective", otherwise negative.  Allergies  Allergen Reactions  . Penicillins   . Sulfonamide Derivatives     Current Outpatient Prescriptions  Medication Sig Dispense Refill  . amLODipine (NORVASC) 10 MG tablet Take 1 tablet (10 mg total) by mouth daily. 90 tablet 3  . furosemide (LASIX) 20 MG tablet Take 1 tablet (20 mg total) by mouth as needed for edema (swelling). 30 tablet 2  . guanFACINE (TENEX) 2 MG tablet Take 2 mg by mouth at bedtime.    . metoprolol succinate (TOPROL-XL) 50 MG 24 hr tablet Take 1 tablet (50 mg total) by mouth daily. Take with or immediately following a meal. 90 tablet 3  . omeprazole (PRILOSEC) 20 MG capsule Take 20 mg by mouth daily as needed.     No current facility-administered medications for this visit.     Past Medical History:  Diagnosis Date  . Anxiety   . Burning with urination   . Chronic diastolic CHF (congestive heart failure) (HCC)   . Constipation   . Coronary artery fistula    ??? by echo 08/2009../..cardiac CTA...09/05/2009...no coronary  fistula  . Dizziness    Nonspecific, May, 2012, feeling off balance  . Ejection fraction < 50%    EF 35-40%...echo...january 27,2011/cardiac cta EF 51%...09/05/2009 LVH..moderate...echo january 2011  . Fluid overload    April, 2012  . Hypertension    severe,but very responsive to amlodipine and hydrochlorithizide  overweight  . LVH (left ventricular hypertrophy)    Moderate echo, January, 2011  . Migraine   . Obesity   . Palpitation   . Pulmonary hypertension (HCC)    60 mmHg echo January, 2011 / normal pulmonary arteries by CT angiography  . Shortness of breath   . Tricuspid regurgitation    moderate ...echo..january 2011  . UTI (urinary tract infection)    recurrent  . Weight gain    August, 2012    Past Surgical History:  Procedure Laterality Date  . CESAREAN SECTION    . CYSTOSCOPY/URETEROSCOPY/HOLMIUM LASER/STENT PLACEMENT Right 03/29/2017   Procedure: CYSTOSCOPY/URETEROSCOPY/HOLMIUM LASER/STENT PLACEMENT;  Surgeon: Sebastian Ache, MD;  Location: [facility];  Service: Urology;  Laterality: Right;  . TUBAL LIGATION      Social History   Social History  . Marital status: Married    Spouse name: N/A  . Number of children: 2  . Years of education: N/A   Occupational History  . full time [employer]   Social History Main Topics  . Smoking status: Never Smoker  . Smokeless tobacco: Never Used  . Alcohol use No  . Drug use: No  . Sexual activity: Not on file   Other  Topics Concern  . Not on file   Social History Narrative  . No narrative on file     Vitals:   04/25/17 0915  BP: (!) 140/100  Pulse: 74  SpO2: 98%  Weight: 239 lb (108.4 kg)  Height:  (1.6 m)    Wt Readings from Last 3 Encounters:  04/25/17 239 lb (108.4 kg)  10/28/16 238 lb 9.6 oz (108.2 kg)  04/19/16 231 lb 9.6 oz (105.1 kg)     PHYSICAL EXAM General: NAD HEENT: Normal. Neck: No JVD, no thyromegaly. Lungs: Clear to auscultation bilaterally with normal respiratory effort. CV:  Nondisplaced PMI.  Regular rate and rhythm, normal S1/S2, no S3/S4, no murmur. Trace bilateral periankle edema.     Abdomen: Soft, nontender, no distention.  Neurologic: Alert and oriented.  Psych: Normal affect. Skin: Normal. Musculoskeletal: No gross deformities.    ECG: Most recent ECG reviewed.   Labs: No results found for: K, BUN, CREATININE, ALT, TSH, HGB   Lipids: No results found for: LDLCALC, LDLDIRECT, CHOL, TRIG, HDL     ASSESSMENT AND PLAN:  1. 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 She is already taking 10 mg amlodipine daily and Toprol-XL 50 mg daily. She is back on guanfacine. I will start losartan 25 mg daily. After 5 days she can stop guanfacine.  2. Leg edema: Continue Lasix 20 mg prn.  3. Morbid obesity: I educated her on the importance of exercise for weight loss.   Disposition: Follow up 6 months.   Prentice Docker, M.D., F.A.C.C.

## 2017-04-25 NOTE — Patient Instructions (Addendum)
Medication Instructions:  Your physician has recommended you make the following change in your medication:   Start Losartan 25 mg daily  After 5 days STOP Guanfacine   Please continue all other medications as prescribed  Labwork: NONE  Testing/Procedures: NONE  Follow-Up: Your physician wants you to follow-up in: 6 MONTHS WITH DR. Purvis Sheffield. You will receive a reminder letter in the mail two months in advance. If you don't receive a letter, please call our office to schedule the follow-up appointment.  Any Other Special Instructions Will Be Listed Below (If Applicable).  If you need a refill on your cardiac medications before your next appointment, please call your pharmacy.

## 2017-10-17 ENCOUNTER — Encounter: Payer: Self-pay | Admitting: Cardiovascular Disease

## 2017-10-17 ENCOUNTER — Other Ambulatory Visit: Payer: Self-pay

## 2017-10-17 ENCOUNTER — Ambulatory Visit (INDEPENDENT_AMBULATORY_CARE_PROVIDER_SITE_OTHER): Payer: BLUE CROSS/BLUE SHIELD | Admitting: Cardiovascular Disease

## 2017-10-17 VITALS — BP 132/83 | HR 77 | Ht 63.0 in | Wt 235.0 lb

## 2017-10-17 DIAGNOSIS — R600 Localized edema: Secondary | ICD-10-CM | POA: Diagnosis not present

## 2017-10-17 DIAGNOSIS — I119 Hypertensive heart disease without heart failure: Secondary | ICD-10-CM

## 2017-10-17 DIAGNOSIS — I10 Essential (primary) hypertension: Secondary | ICD-10-CM | POA: Diagnosis not present

## 2017-10-17 DIAGNOSIS — I5030 Unspecified diastolic (congestive) heart failure: Secondary | ICD-10-CM | POA: Diagnosis not present

## 2017-10-17 MED ORDER — LOSARTAN POTASSIUM 25 MG PO TABS: 25.0000 mg | ORAL_TABLET | Freq: Every day | ORAL | 3 refills | Status: DC

## 2017-10-17 NOTE — Addendum Note (Signed)
Addended by: Norva PavlovJOYCE, Giorgi Debruin on: 10/17/2017 09:44 AM   Modules accepted: Orders

## 2017-10-17 NOTE — Progress Notes (Signed)
SUBJECTIVE: The patientpresents for routine follow-up. She hasa history of hypertensive heart disease with chronic diastolic heart failure. She developed a hypertensive cardiomyopathy but with good blood pressure control LV systolic function normalized.  Echocardiogram on 03/23/14 demonstrated vigorous left ventricular systolic function and normal regional wall motion, LVEF 65-70%, grade 2 diastolic dysfunction with increased filling pressures, mild to moderate left atrial dilatation, and mild right atrial dilatation.  She underwent unremarkable Holter monitoring in June 2016.  She was recently on Provera and just finished it.  She had been having vaginal bleeding and was found to have an ovarian cyst.  She never tried the losartan prescribed at her last visit.  She could no longer obtain guanfacine and her PCP started clonidine.  She tells me she was recently at [facility] for lower extremity Dopplers which were negative for DVT.  She has been having leg pain.  She thinks it is a side effect from Provera which she recently stopped.  She denies chest pain and shortness of breath.  She rarely takes Lasix.  ECG performed in the office today which I ordered and personally interpreted demonstrates normal sinus rhythm with no ischemic ST segment or T-wave abnormalities, nor any arrhythmias.       Review of Systems: As per "subjective", otherwise negative.  Allergies  Allergen Reactions  . Penicillins   . Sulfonamide Derivatives     Current Outpatient Medications  Medication Sig Dispense Refill  . amLODipine (NORVASC) 10 MG tablet Take 1 tablet (10 mg total) by mouth daily. 90 tablet 3  . cloNIDine (CATAPRES) 0.1 MG tablet Take 0.1 mg by mouth 2 (two) times daily.    . furosemide (LASIX) 20 MG tablet Take 1 tablet (20 mg total) by mouth as needed for edema (swelling). 30 tablet 2  . metoprolol succinate (TOPROL-XL) 50 MG 24 hr tablet Take 1 tablet (50 mg total) by mouth  daily. Take with or immediately following a meal. 90 tablet 3  . omeprazole (PRILOSEC) 20 MG capsule Take 20 mg by mouth daily as needed.     No current facility-administered medications for this visit.     Past Medical History:  Diagnosis Date  . Anxiety   . Burning with urination   . Chronic diastolic CHF (congestive heart failure) (HCC)   . Constipation   . Coronary artery fistula    ??? by echo 08/2009../..cardiac CTA...09/05/2009...no coronary fistula  . Dizziness    Nonspecific, May, 2012, feeling off balance  . Ejection fraction < 50%    EF 35-40%...echo...january 27,2011/cardiac cta EF 51%...09/05/2009 LVH..moderate...echo january 2011  . Fluid overload    April, 2012  . Hypertension    severe,but very responsive to amlodipine and hydrochlorithizide  overweight  . LVH (left ventricular hypertrophy)    Moderate echo, January, 2011  . Migraine   . Obesity   . Palpitation   . Pulmonary hypertension (HCC)    60 mmHg echo January, 2011 / normal pulmonary arteries by CT angiography  . Shortness of breath   . Tricuspid regurgitation    moderate ...echo..january 2011  . UTI (urinary tract infection)    recurrent  . Weight gain    August, 2012    Past Surgical History:  Procedure Laterality Date  . CESAREAN SECTION    . CYSTOSCOPY/URETEROSCOPY/HOLMIUM LASER/STENT PLACEMENT Right 03/29/2017   Procedure: CYSTOSCOPY/URETEROSCOPY/HOLMIUM LASER/STENT PLACEMENT;  Surgeon: Sebastian Ache, MD;  Location: [facility];  Service: Urology;  Laterality: Right;  . TUBAL LIGATION  Social History   Socioeconomic History  . Marital status: Married    Spouse name: Not on file  . Number of children: 2  . Years of education: Not on file  . Highest education level: Not on file  Social Needs  . Financial resource strain: Not on file  . Food insecurity - worry: Not on file  . Food insecurity - inability: Not on file  . Transportation needs - medical: Not on file  . Transportation needs -  non-medical: Not on file  Occupational History  . Occupation: full time    Employer: [employer]  Tobacco Use  . Smoking status: Never Smoker  . Smokeless tobacco: Never Used  Substance and Sexual Activity  . Alcohol use: No    Alcohol/week: 0.0 oz  . Drug use: No  . Sexual activity: Not on file  Other Topics Concern  . Not on file  Social History Narrative  . Not on file     Vitals:   10/17/17 0900  BP: 132/83  Pulse: 77  SpO2: 99%  Weight: 235 lb (106.6 kg)  Height: 5\' 3"  (1.6 m)    Wt Readings from Last 3 Encounters:  10/17/17 235 lb (106.6 kg)  04/25/17 239 lb (108.4 kg)  10/28/16 238 lb 9.6 oz (108.2 kg)     PHYSICAL EXAM General: NAD HEENT: Normal. Neck: No JVD, no thyromegaly. Lungs: Clear to auscultation bilaterally with normal respiratory effort. CV: Regular rate and rhythm, normal S1/S2, no S3/S4, no murmur. No pretibial or periankle edema.  No carotid bruit.   Abdomen: Soft, nontender, no distention.  Neurologic: Alert and oriented.  Psych: Normal affect. Skin: Normal. Musculoskeletal: No gross deformities.    ECG: Most recent ECG reviewed.   Labs: No results found for: K, BUN, CREATININE, ALT, TSH, HGB   Lipids: No results found for: LDLCALC, LDLDIRECT, CHOL, TRIG, HDL     ASSESSMENT AND PLAN: 1. Hypertensive cardiomyopathy with heart failure with preserved ejection fraction and grade II diastolic dysfunction: BP is normal.  As clonidine is not an optimal first-line antihypertensive agent, I will discontinue this and simultaneously start losartan 25 mg daily.  I will check a basic metabolic panel within a few days of initiation to monitor kidney function and potassium.  Continue amlodipine 10 mg and Toprol-XL 50 mg.  2. Leg edema: Continue Lasix 20 mg prn.  3. Morbid obesity: I previously educated her on the importance of exercise for weight loss.      Disposition: Follow up 1 year.   Prentice DockerSuresh Koneswaran, M.D., F.A.C.C.

## 2017-10-17 NOTE — Patient Instructions (Addendum)
Medication Instructions:  Your physician has recommended you make the following change in your medication:    STOP Clonidine    START Losartan 25 mg daily   Please continue all other medications as prescribed  Labwork:  BMET  Orders given today  In 3 days  Testing/Procedures: NONE  Follow-Up: Your physician wants you to follow-up in: 1 YEAR WITH DR. Purvis SheffieldKONESWARAN You will receive a reminder letter in the mail two months in advance. If you don't receive a letter, please call our office to schedule the follow-up appointment.  Any Other Special Instructions Will Be Listed Below (If Applicable).  If you need a refill on your cardiac medications before your next appointment, please call your pharmacy.

## 2017-12-24 ENCOUNTER — Other Ambulatory Visit: Payer: Self-pay | Admitting: Cardiovascular Disease

## 2018-10-05 ENCOUNTER — Encounter: Payer: Self-pay | Admitting: Cardiovascular Disease

## 2018-10-05 ENCOUNTER — Ambulatory Visit (INDEPENDENT_AMBULATORY_CARE_PROVIDER_SITE_OTHER): Payer: BLUE CROSS/BLUE SHIELD | Admitting: Cardiovascular Disease

## 2018-10-05 ENCOUNTER — Encounter: Payer: Self-pay | Admitting: *Deleted

## 2018-10-05 VITALS — BP 145/96 | HR 82 | Ht 63.0 in | Wt 245.0 lb

## 2018-10-05 DIAGNOSIS — I10 Essential (primary) hypertension: Secondary | ICD-10-CM | POA: Diagnosis not present

## 2018-10-05 DIAGNOSIS — R10819 Abdominal tenderness, unspecified site: Secondary | ICD-10-CM

## 2018-10-05 DIAGNOSIS — R600 Localized edema: Secondary | ICD-10-CM | POA: Diagnosis not present

## 2018-10-05 DIAGNOSIS — I5032 Chronic diastolic (congestive) heart failure: Secondary | ICD-10-CM

## 2018-10-05 MED ORDER — LISINOPRIL 40 MG PO TABS: 40.0000 mg | ORAL_TABLET | Freq: Every day | ORAL | 6 refills | Status: DC

## 2018-10-05 MED ORDER — FUROSEMIDE 20 MG PO TABS: 20.0000 mg | ORAL_TABLET | ORAL | 2 refills | Status: DC | PRN

## 2018-10-05 NOTE — Progress Notes (Signed)
SUBJECTIVE: The patientpresents for routine follow-up. She hasa history of hypertensive heart disease with chronic diastolic heart failure. She developed a hypertensive cardiomyopathy but with good blood pressure control LV systolic function normalized.  Echocardiogram on 03/23/14 demonstrated vigorous left ventricular systolic function and normal regional wall motion, LVEF 65-70%, grade 2 diastolic dysfunction with increased filling pressures, mild to moderate left atrial dilatation, and mild right atrial dilatation.  She underwent unremarkable Holter monitoring in June 2016.  ECG performed in the office today which I ordered and personally interpreted demonstrates normal sinus rhythm with no ischemic ST segment or T-wave abnormalities, nor any arrhythmias.  She denies chest pain and palpitations.  She has put on about 10 pounds since her last visit with me in March 2019 and does have slightly more exertional dyspnea.  Losartan led to headaches for which she was switched to lisinopril by her PCP.  She has had some burning with micturition and is due to have an MRI of her abdomen to assess for kidney stones I am told.   Review of Systems: As per "subjective", otherwise negative.  Allergies  Allergen Reactions  . Penicillins   . Sulfonamide Derivatives     Current Outpatient Medications  Medication Sig Dispense Refill  . amLODipine (NORVASC) 10 MG tablet TAKE 1 TABLET BY MOUTH EVERY DAY 90 tablet 3  . furosemide (LASIX) 20 MG tablet Take 1 tablet (20 mg total) by mouth as needed for edema (swelling). 30 tablet 2  . lisinopril (PRINIVIL,ZESTRIL) 20 MG tablet     . metoprolol succinate (TOPROL-XL) 50 MG 24 hr tablet TAKE 1 TABLET BY MOUTH EVERY DAY WITH FOOD OR IMMEDIATELY FOLLING MEAL 90 tablet 3  . omeprazole (PRILOSEC) 20 MG capsule Take 20 mg by mouth daily as needed.     No current facility-administered medications for this visit.     Past Medical History:  Diagnosis  Date  . Anxiety   . Burning with urination   . Chronic diastolic CHF (congestive heart failure) (HCC)   . Constipation   . Coronary artery fistula    ??? by echo 08/2009../..cardiac CTA...09/05/2009...no coronary fistula  . Dizziness    Nonspecific, May, 2012, feeling off balance  . Ejection fraction < 50%    EF 35-40%...echo...january 27,2011/cardiac cta EF 51%...09/05/2009 LVH..moderate...echo january 2011  . Fluid overload    April, 2012  . Hypertension    severe,but very responsive to amlodipine and hydrochlorithizide  overweight  . LVH (left ventricular hypertrophy)    Moderate echo, January, 2011  . Migraine   . Obesity   . Palpitation   . Pulmonary hypertension (HCC)    60 mmHg echo January, 2011 / normal pulmonary arteries by CT angiography  . Shortness of breath   . Tricuspid regurgitation    moderate ...echo..january 2011  . UTI (urinary tract infection)    recurrent  . Weight gain    August, 2012    Past Surgical History:  Procedure Laterality Date  . CESAREAN SECTION    . CYSTOSCOPY/URETEROSCOPY/HOLMIUM LASER/STENT PLACEMENT Right 03/29/2017   Procedure: CYSTOSCOPY/URETEROSCOPY/HOLMIUM LASER/STENT PLACEMENT;  Surgeon: Sebastian Ache, MD;  Location: [facility];  Service: Urology;  Laterality: Right;  . TUBAL LIGATION      Social History   Socioeconomic History  . Marital status: Married    Spouse name: Not on file  . Number of children: 2  . Years of education: Not on file  . Highest education level: Not on file  Occupational History  .  Occupation: full time    Employer: [employer]  Social Needs  . Financial resource strain: Not on file  . Food insecurity:    Worry: Not on file    Inability: Not on file  . Transportation needs:    Medical: Not on file    Non-medical: Not on file  Tobacco Use  . Smoking status: Never Smoker  . Smokeless tobacco: Never Used  Substance and Sexual Activity  . Alcohol use: No    Alcohol/week: 0.0 standard drinks  . Drug use:  No  . Sexual activity: Not on file  Lifestyle  . Physical activity:    Days per week: Not on file    Minutes per session: Not on file  . Stress: Not on file  Relationships  . Social connections:    Talks on phone: Not on file    Gets together: Not on file    Attends religious service: Not on file    Active member of club or organization: Not on file    Attends meetings of clubs or organizations: Not on file    Relationship status: Not on file  . Intimate partner violence:    Fear of current or ex partner: Not on file    Emotionally abused: Not on file    Physically abused: Not on file    Forced sexual activity: Not on file  Other Topics Concern  . Not on file  Social History Narrative  . Not on file     Vitals:   10/05/18 1409  BP: (!) 145/96  Pulse: 82  SpO2: 99%  Weight: 245 lb (111.1 kg)  Height: 5\' 3"  (1.6 m)    Wt Readings from Last 3 Encounters:  10/05/18 245 lb (111.1 kg)  10/17/17 235 lb (106.6 kg)  04/25/17 239 lb (108.4 kg)     PHYSICAL EXAM General: NAD HEENT: Normal. Neck: No JVD, no thyromegaly. Lungs: Clear to auscultation bilaterally with normal respiratory effort. CV: Regular rate and rhythm, normal S1/S2, no S3/S4, no murmur. No pretibial or periankle edema.  No carotid bruit.   Abdomen: Soft, nontender, no distention.  Neurologic: Alert and oriented.  Psych: Normal affect. Skin: Normal. Musculoskeletal: Bilateral flank tenderness.    ECG: Reviewed above under Subjective   Labs: No results found for: K, BUN, CREATININE, ALT, TSH, HGB   Lipids: No results found for: LDLCALC, LDLDIRECT, CHOL, TRIG, HDL     ASSESSMENT AND PLAN:  1. Hypertensive cardiomyopathy with heart failure with preserved ejection fraction and grade II diastolic dysfunction: BP is elevated.   At her last visit I prescribed losartan but she is taking lisinopril 20 mg as losartan led to headaches.  I will increase to 40 mg. Continue amlodipine 10 mg and Toprol-XL 50  mg.  She may need additional antihypertensive therapy.  It is likely being exacerbated by lower back and flank pain.  2. Leg edema: Continue Lasix 20 mg prn.  I will refill.  3. Morbid obesity: I previously educated her on the importance of exercise for weight loss.  4.  Bilateral flank tenderness: She is being evaluated for kidney stones.  She appears to have some paraspinal lumbar spasm as well.   Disposition: Follow up 3 months as per her request   Prentice Docker, M.D., F.A.C.C.

## 2018-10-05 NOTE — Patient Instructions (Signed)
Medication Instructions:   Increase Lisinopril to 40mg  daily.   Continue all other medications.     Follow-Up: 3 months   Any Other Special Instructions Will Be Listed Below (If Applicable).  If you need a refill on your cardiac medications before your next appointment, please call your pharmacy.

## 2018-10-06 ENCOUNTER — Telehealth: Payer: Self-pay | Admitting: Cardiovascular Disease

## 2018-10-06 NOTE — Telephone Encounter (Signed)
Patient advised that taking a fluid pill doesn't excuse her from working; however, this message would be sent to her provider for a response. Advised that provider out of office and message would not be seen by him until tomorrow. Verbalized understanding.

## 2018-10-06 NOTE — Telephone Encounter (Signed)
Patient called requesting to see if Dr. Purvis Sheffield would write out her another note to be absent from work today due to taking a fluid pill.

## 2018-10-07 NOTE — Telephone Encounter (Signed)
Patient notified via detailed voice message.

## 2018-10-07 NOTE — Telephone Encounter (Signed)
Unable to provide a letter.

## 2018-12-29 ENCOUNTER — Other Ambulatory Visit: Payer: Self-pay | Admitting: Cardiovascular Disease

## 2018-12-30 ENCOUNTER — Telehealth: Payer: Self-pay | Admitting: Cardiovascular Disease

## 2018-12-30 NOTE — Telephone Encounter (Signed)
°*  STAT* If patient is at the pharmacy, call can be transferred to refill team.   1. Which medications need to be refilled? (please list name of each medication and dose if known)   amLODipine (NORVASC) 10 MG tablet [[id number]]    2. Which pharmacy/location (including street and city if local pharmacy) is medication to be sent to?  [facility] [place]    3. Do they need a 30 day or 90 day supply?  90 day

## 2018-12-31 MED ORDER — AMLODIPINE BESYLATE 10 MG PO TABS: 10.0000 mg | ORAL_TABLET | Freq: Every day | ORAL | 1 refills | Status: DC

## 2018-12-31 NOTE — Telephone Encounter (Signed)
Medication sent to pharmacy  

## 2019-01-03 ENCOUNTER — Other Ambulatory Visit: Payer: Self-pay | Admitting: Cardiovascular Disease

## 2019-01-14 ENCOUNTER — Encounter: Payer: Self-pay | Admitting: Cardiovascular Disease

## 2019-01-14 ENCOUNTER — Telehealth (INDEPENDENT_AMBULATORY_CARE_PROVIDER_SITE_OTHER): Payer: BC Managed Care – PPO | Admitting: Cardiovascular Disease

## 2019-01-14 VITALS — BP 150/105 | HR 67 | Ht 63.0 in | Wt 245.0 lb

## 2019-01-14 DIAGNOSIS — I5032 Chronic diastolic (congestive) heart failure: Secondary | ICD-10-CM

## 2019-01-14 DIAGNOSIS — R600 Localized edema: Secondary | ICD-10-CM

## 2019-01-14 DIAGNOSIS — I10 Essential (primary) hypertension: Secondary | ICD-10-CM

## 2019-01-14 NOTE — Progress Notes (Signed)
Virtual Visit via Telephone Note   This visit type was conducted due to national recommendations for restrictions regarding the COVID-19 Pandemic (e.g. social distancing) in an effort to limit this patient's exposure and mitigate transmission in our community.  Due to her co-morbid illnesses, this patient is at least at moderate risk for complications without adequate follow up.  This format is felt to be most appropriate for this patient at this time.  The patient did not have access to video technology/had technical difficulties with video requiring transitioning to audio format only (telephone).  All issues noted in this document were discussed and addressed.  No physical exam could be performed with this format.  Please refer to the patient's chart for her  consent to telehealth for [facility].   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 She developed a hypertensive cardiomyopathy but with good blood pressure control LV systolic function normalized.  BP has been running borderline to high. She is still having foul-smelling urine. Her PCP has referred her to urology.  She denies chest pain, shortness of breath, and leg swelling.  The patient does not have symptoms concerning for COVID-19 infection (fever, chills, cough, or new shortness of breath).    Past Medical History:  Diagnosis Date  . Anxiety   . Burning with urination   . Chronic diastolic CHF (congestive heart failure) ([place])   . Constipation   . Coronary artery fistula    ??? by  echo 08/2009../..cardiac CTA...09/05/2009...no coronary fistula  . Dizziness    Nonspecific, May, 2012, feeling off balance  . Ejection fraction < 50%    EF 35-40%...echo...january 27,2011/cardiac cta EF 51%...09/05/2009 LVH..moderate...echo january 2011  . Fluid overload    April, 2012  . Hypertension    severe,but very responsive to amlodipine and hydrochlorithizide  overweight  . LVH (left ventricular hypertrophy)    Moderate echo, January, 2011  . Migraine   . Obesity   . Palpitation   . Pulmonary hypertension ([place])    60 mmHg echo January, 2011 / normal pulmonary arteries by CT angiography  . Shortness of breath   . Tricuspid regurgitation    moderate ...echo..january 2011  . UTI (urinary tract infection)    recurrent  . Weight gain    August, 2012   Past Surgical History:  Procedure Laterality Date  . CESAREAN SECTION    . CYSTOSCOPY/URETEROSCOPY/HOLMIUM LASER/STENT PLACEMENT Right 03/29/2017   Procedure: CYSTOSCOPY/URETEROSCOPY/HOLMIUM LASER/STENT PLACEMENT;  Surgeon: Alexis Frock, MD;  Location: [facility];  Service: Urology;  Laterality: Right;  . TUBAL LIGATION       Current Meds  Medication Sig  . amLODipine (NORVASC) 10 MG tablet Take 1 tablet (10 mg total) by mouth daily.  . furosemide (LASIX) 20 MG tablet TAKE 1 TABLET (20 MG TOTAL) BY MOUTH AS NEEDED FOR EDEMA (SWELLING).  Marland Kitchen lisinopril (PRINIVIL,ZESTRIL) 40 MG tablet Take 1 tablet (40 mg total) by mouth daily.  . metoprolol succinate (TOPROL-XL) 50 MG 24 hr tablet TAKE 1 TABLET BY MOUTH EVERY DAY WITH FOOD OR IMMEDIATELY FOLLING MEAL  .  omeprazole (PRILOSEC) 20 MG capsule Take 20 mg by mouth daily as needed.     Allergies:   Penicillins and Sulfonamide derivatives   Social History   Tobacco Use  . Smoking status: Never Smoker  . Smokeless tobacco: Never Used  Substance Use Topics  . Alcohol use: No    Alcohol/week: 0.0 standard drinks  . Drug use: No     Family Hx: The patient's family history includes  Cancer in an other family member.  ROS:   Please see the history of present illness.     All other systems reviewed and are negative.   Prior CV studies:   The following studies were reviewed today:  Echocardiogram on 03/23/14 demonstrated vigorous left ventricular systolic function and normal regional wall motion, LVEF 65-70%, grade 2 diastolic dysfunction with increased filling pressures, mild to moderate left atrial dilatation, and mild right atrial dilatation.  Labs/Other Tests and Data Reviewed:    EKG:  No ECG reviewed.  Recent Labs: No results found for requested labs within last 8760 hours.   Recent Lipid Panel No results found for: CHOL, TRIG, HDL, CHOLHDL, LDLCALC, LDLDIRECT  Wt Readings from Last 3 Encounters:  01/14/19 245 lb (111.1 kg)  10/05/18 245 lb (111.1 kg)  10/17/17 235 lb (106.6 kg)     Objective:    Vital Signs:  BP (!) 150/105   Pulse 67   Ht 5\' 3"  (1.6 m)   Wt 245 lb (111.1 kg)   BMI 43.40 kg/m    VITAL SIGNS:  reviewed  ASSESSMENT & PLAN:    1. Hypertensive cardiomyopathy withheart failure with preserved ejection fractionand grade II diastolic dysfunction: BP is elevated today. It normally runs 140/90 range at home.Losartan previously led to headaches. She is on lisinopril 40 mg, amlodipine 10 mg and Toprol-XL 50 mg. Weight stable from 10/05/18. I will start hydralazine 25 mg bid.  2. Leg edema: Continue Lasix 20 mg prn.   3. Morbid obesity: Ipreviouslyeducated her on the importance of exercise for weight loss.    COVID-19 Education: The signs and symptoms of COVID-19 were discussed with the patient and how to seek care for testing (follow up with PCP or arrange E-visit).  The importance of social distancing was discussed today.  Time:   Today, I have spent 15 minutes with the patient with telehealth technology discussing the above problems.     Medication Adjustments/Labs and Tests Ordered: Current medicines are reviewed at  length with the patient today.  Concerns regarding medicines are outlined above.   Tests Ordered: No orders of the defined types were placed in this encounter.   Medication Changes: No orders of the defined types were placed in this encounter.   Disposition:  Follow up in 6 month(s)  Signed, Prentice DockerSuresh , MD  01/14/2019 2:41 PM    [facility]

## 2019-01-15 MED ORDER — HYDRALAZINE HCL 25 MG PO TABS: 25.0000 mg | ORAL_TABLET | Freq: Two times a day (BID) | ORAL | 6 refills | Status: DC

## 2019-01-15 NOTE — Addendum Note (Signed)
Addended by: Laurine Blazer on: 01/15/2019 04:40 PM   Modules accepted: Orders

## 2019-01-15 NOTE — Patient Instructions (Signed)
Medication Instructions:   Begin Hydralazine 25mg  twice a day - new prescription sent to pharmacy today.  Continue all other medications.    Labwork: none  Testing/Procedures: none  Follow-Up: Your physician wants you to follow up in: 6 months.  You will receive a reminder letter in the mail one-two months in advance.  If you don't receive a letter, please call our office to schedule the follow up appointment   Any Other Special Instructions Will Be Listed Below (If Applicable).  If you need a refill on your cardiac medications before your next appointment, please call your pharmacy.

## 2019-07-07 ENCOUNTER — Other Ambulatory Visit: Payer: Self-pay | Admitting: Cardiovascular Disease

## 2019-07-30 ENCOUNTER — Other Ambulatory Visit: Payer: Self-pay | Admitting: Cardiovascular Disease

## 2019-08-30 ENCOUNTER — Other Ambulatory Visit: Payer: Self-pay | Admitting: Cardiovascular Disease

## 2019-09-24 ENCOUNTER — Other Ambulatory Visit: Payer: Self-pay | Admitting: Cardiovascular Disease

## 2019-10-08 ENCOUNTER — Ambulatory Visit (INDEPENDENT_AMBULATORY_CARE_PROVIDER_SITE_OTHER): Payer: 59 | Admitting: Cardiovascular Disease

## 2019-10-08 ENCOUNTER — Other Ambulatory Visit: Payer: Self-pay

## 2019-10-08 ENCOUNTER — Encounter: Payer: Self-pay | Admitting: Cardiovascular Disease

## 2019-10-08 VITALS — BP 132/94 | HR 94 | Ht 63.0 in | Wt 244.0 lb

## 2019-10-08 DIAGNOSIS — I10 Essential (primary) hypertension: Secondary | ICD-10-CM

## 2019-10-08 DIAGNOSIS — I119 Hypertensive heart disease without heart failure: Secondary | ICD-10-CM | POA: Diagnosis not present

## 2019-10-08 DIAGNOSIS — R600 Localized edema: Secondary | ICD-10-CM | POA: Diagnosis not present

## 2019-10-08 MED ORDER — HYDRALAZINE HCL 25 MG PO TABS: 25.0000 mg | ORAL_TABLET | Freq: Two times a day (BID) | ORAL | 6 refills | Status: DC

## 2019-10-08 NOTE — Patient Instructions (Addendum)
Medication Instructions:   Stop Lisinopril.   Begin Hydralazine 25mg  twice a day.  Continue all other medications.    Labwork: none  Testing/Procedures: none  Follow-Up: 3 months   Any Other Special Instructions Will Be Listed Below (If Applicable).  If you need a refill on your cardiac medications before your next appointment, please call your pharmacy.

## 2019-10-08 NOTE — Progress Notes (Signed)
SUBJECTIVE: [name] is a 44 y.o. 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 She developed a hypertensive cardiomyopathy but with good blood pressure control LV systolic function normalized.  She was apparently hospitalized in [place] with COVID-19 pneumonia in November 2020.  She was hospitalized for 5 days.  She initially lost 15 pounds in the hospital but says she has put on 25 pounds since then.  She thinks her initial weight gain began after she started lisinopril and would like to try stopping it.  She has also been having irregularity of menstrual cycles and was told she is perimenopausal.  Since having COVID-19, she has had occasional weak spells.  She has to walk up 42 steps at work and has no difficulty doing this.  She believes an echocardiogram was performed in [place].    Review of Systems: As per "subjective", otherwise negative.  Allergies  Allergen Reactions  . Penicillins   . Sulfonamide Derivatives     Current Outpatient Medications  Medication Sig Dispense Refill  . amLODipine (NORVASC) 10 MG tablet TAKE 1 TABLET BY MOUTH EVERY DAY 90 tablet 1  . furosemide (LASIX) 20 MG tablet TAKE 1 TABLET (20 MG TOTAL) BY MOUTH AS NEEDED FOR EDEMA (SWELLING). 90 tablet 0  . lisinopril (PRINIVIL,ZESTRIL) 40 MG tablet Take 1 tablet (40 mg total) by mouth daily. 30 tablet 6  . metoprolol succinate (TOPROL-XL) 50 MG 24 hr tablet TAKE 1 TABLET BY MOUTH EVERY DAY WITH FOOD OR IMMEDIATELY FOLLING MEAL 90 tablet 3  . omeprazole (PRILOSEC) 20 MG capsule Take 20 mg by mouth daily as needed.     No current facility-administered medications for this visit.    Past Medical History:  Diagnosis Date  . Anxiety   . Burning with urination   . Chronic diastolic CHF (congestive heart failure) ([facility])   . Constipation   . Coronary artery fistula    ??? by echo 08/2009../..cardiac CTA...09/05/2009...no coronary fistula  .  Dizziness    Nonspecific, May, 2012, feeling off balance  . Ejection fraction < 50%    EF 35-40%...echo...january 27,2011/cardiac cta EF 51%...09/05/2009 LVH..moderate...echo january 2011  . Fluid overload    April, 2012  . Hypertension    severe,but very responsive to amlodipine and hydrochlorithizide  overweight  . LVH (left ventricular hypertrophy)    Moderate echo, January, 2011  . Migraine   . Obesity   . Palpitation   . Pulmonary hypertension (H[facility])    60 mmHg echo January, 2011 / normal pulmonary arteries by CT angiography  . Shortness of breath   . Tricuspid regurgitation    moderate ...echo..january 2011  . UTI (urinary tract infection)    recurrent  . Weight gain    August, 2012    Past Surgical History:  Procedure Laterality Date  . CESAREAN SECTION    . CYSTOSCOPY/URETEROSCOPY/HOLMIUM LASER/STENT PLACEMENT Right 03/29/2017   Procedure: CYSTOSCOPY/URETEROSCOPY/HOLMIUM LASER/STENT PLACEMENT;  Surgeon: Sebastian Ache, MD;  Location: [facility];  Service: Urology;  Laterality: Right;  . TUBAL LIGATION      Social History   Socioeconomic History  . Marital status: Married    Spouse name: Not on file  . Number of children: 2  . Years of education: Not on file  . Highest education level: Not on file  Occupational History  . Occupation: full time    Employer: [employer]  Tobacco Use  . Smoking status: Never Smoker  . Smokeless tobacco: Never Used  Substance and Sexual Activity  . Alcohol use: No    Alcohol/week: 0.0 standard drinks  . Drug use: No  . Sexual activity: Not on file  Other Topics Concern  . Not on file  Social History Narrative  . Not on file   Social Determinants of Health   Financial Resource Strain:   . Difficulty of Paying Living Expenses: Not on file  Food Insecurity:   . Worried About Charity fundraiser in the Last Year: Not on file  . Ran Out of Food in the Last Year: Not on file  Transportation Needs:   . Lack of Transportation  (Medical): Not on file  . Lack of Transportation (Non-Medical): Not on file  Physical Activity:   . Days of Exercise per Week: Not on file  . Minutes of Exercise per Session: Not on file  Stress:   . Feeling of Stress : Not on file  Social Connections:   . Frequency of Communication with Friends and Family: Not on file  . Frequency of Social Gatherings with Friends and Family: Not on file  . Attends Religious Services: Not on file  . Active Member of Clubs or Organizations: Not on file  . Attends Archivist Meetings: Not on file  . Marital Status: Not on file  Intimate Partner Violence:   . Fear of Current or Ex-Partner: Not on file  . Emotionally Abused: Not on file  . Physically Abused: Not on file  . Sexually Abused: Not on file    Orson Slick, LPN was present throughout the entirety of the encounter.  Vitals:   10/08/19 1414  BP: (!) 132/94  Pulse: 94  SpO2: 98%  Weight: 244 lb (110.7 kg)  Height: 5\' 3"  (1.6 m)    Wt Readings from Last 3 Encounters:  10/08/19 244 lb (110.7 kg)  01/14/19 245 lb (111.1 kg)  10/05/18 245 lb (111.1 kg)     PHYSICAL EXAM General: NAD HEENT: Normal. Neck: No JVD, no thyromegaly. Lungs: Clear to auscultation bilaterally with normal respiratory effort. CV: Regular rate and rhythm, normal S1/S2, no S3/S4, no murmur. No pretibial or periankle edema.  No carotid bruit.   Abdomen: Soft, nontender, no distention.  Neurologic: Alert and oriented.  Psych: Normal affect. Skin: Normal. Musculoskeletal: No gross deformities.      Labs: No results found for: K, BUN, CREATININE, ALT, TSH, HGB   Lipids: No results found for: LDLCALC, LDLDIRECT, CHOL, TRIG, HDL    Prior CV studies:   The following studies were reviewed today:  Echocardiogram on 03/23/14 demonstrated vigorous left ventricular systolic function and normal regional wall motion, LVEF 32-99%, grade 2 diastolic dysfunction with increased filling pressures, mild to  moderate left atrial dilatation, and mild right atrial dilatation.    ASSESSMENT AND PLAN:   1. Hypertensive cardiomyopathy withheart failure with preserved ejection fractionand grade II diastolic dysfunction: Diastolic blood pressure is mildly elevated. Losartan previously led to headaches. She is on lisinopril 40 mg, amlodipine 10 mg and Toprol-XL 50 mg.Weight stable from 10/05/18.  I prescribed hydralazine at her last visit but she is not taking this. I will obtain records from [place].  She believes she had an echocardiogram in November 2020. She would like to try stopping lisinopril because she thinks it has led to weight gain.  I will stop lisinopril and start hydralazine 25 mg twice daily (reduced frequency at her request).  Diastolic blood pressures have been running in the 90 range at home.  She  will continue to monitor BP.  2. Leg edema: Continue Lasix 20 mg prn.  3. Morbid obesity: Ipreviouslyeducated her on the importance of exercise for weight loss.  As stated in #1, I will stop lisinopril to see if this has led to any weight gain as per her request.  She is also perimenopausal which can lead to increased weight gain.  4. HTN: I will stop lisinopril and start hydralazine 25 mg twice daily (see discussion #1).   Disposition: Follow up virtual visit 3 months   Prentice Docker, M.D., F.A.C.C.

## 2019-10-12 ENCOUNTER — Encounter: Payer: Self-pay | Admitting: *Deleted

## 2019-12-29 ENCOUNTER — Other Ambulatory Visit: Payer: Self-pay | Admitting: Cardiovascular Disease

## 2020-02-01 ENCOUNTER — Ambulatory Visit: Payer: 59 | Admitting: Cardiovascular Disease

## 2020-06-08 NOTE — Progress Notes (Signed)
Cardiology Office Note  Date: 06/09/2020   ID: [name], DOB 03-15-1976, MRN [id number]  PCP:  Ardyth Man, MD  Cardiologist:  No primary care provider on file. Electrophysiologist:  None   Chief Complaint: Follow-up hypertensive heart disease, chronic diastolic heart failure  History of Present Illness: [name] is a 44 y.o. female with a history of hypertensive heart disease, chronic diastolic heart failure, HTN, bilateral lower extremity edema, morbid obesity.  Last encounter 10/08/2019 with Dr. Purvis Sheffield.  Hospitalized [place] COVID-19 pneumonia in November 2020.  Had initially lost 15 pounds but had put on 25 pounds since hospitalization.  He believes weight gain related to starting lisinopril and was interested in stopping the medication. She was continuing amlodipine 10 mg, Toprol XL 50 mg. Lisinopril was discontinued and she was started on hydralazine 25 mg p.o. twice daily. She was continuing the Lasix 20 mg as needed for leg edema. Dr. Purvis Sheffield educated patient on weight loss.  She is here for 26-month follow-up.  She was originally scheduled to follow-up in July.  She is complaining of episodes of nausea and shakiness at times at work.  She believes the lisinopril may be contributing to some of the symptoms.  She would like to try an alternative to lisinopril.  States she just feels tired all the time.  She works 8 to 10-hour shifts at work and comes home to [employer].  She states by the time she gets home she is worn out.  She has 3 grown children who live with her in addition to a brother who has health issues on dialysis.  She denies any PND, orthopnea, anginal symptoms, dyspnea on exertion.  No CVA or TIA-like symptoms.  No palpitations.  States she recently tried Ozempic to help control her blood sugars better but did not tolerate it well.    Past Medical History:  Diagnosis Date  . Anxiety   . Burning with urination   . Chronic diastolic CHF  (congestive heart failure) ([facility])   . Constipation   . Coronary artery fistula    ??? by echo 08/2009../..cardiac CTA...09/05/2009...no coronary fistula  . Dizziness    Nonspecific, May, 2012, feeling off balance  . Ejection fraction < 50%    EF 35-40%...echo...january 27,2011/cardiac cta EF 51%...09/05/2009 LVH..moderate...echo january 2011  . Fluid overload    April, 2012  . Hypertension    severe,but very responsive to amlodipine and hydrochlorithizide  overweight  . LVH (left ventricular hypertrophy)    Moderate echo, January, 2011  . Migraine   . Obesity   . Palpitation   . Pulmonary hypertension ([facility])    60 mmHg echo January, 2011 / normal pulmonary arteries by CT angiography  . Shortness of breath   . Tricuspid regurgitation    moderate ...echo..january 2011  . UTI (urinary tract infection)    recurrent  . Weight gain    August, 2012    Past Surgical History:  Procedure Laterality Date  . CESAREAN SECTION    . CYSTOSCOPY/URETEROSCOPY/HOLMIUM LASER/STENT PLACEMENT Right 03/29/2017   Procedure: CYSTOSCOPY/URETEROSCOPY/HOLMIUM LASER/STENT PLACEMENT;  Surgeon: Sebastian Ache, MD;  Location: [facility];  Service: Urology;  Laterality: Right;  . TUBAL LIGATION      Current Outpatient Medications  Medication Sig Dispense Refill  . amLODipine (NORVASC) 10 MG tablet TAKE 1 TABLET BY MOUTH EVERY DAY 90 tablet 1  . furosemide (LASIX) 20 MG tablet TAKE 1 TABLET (20 MG TOTAL) BY MOUTH AS NEEDED FOR EDEMA (SWELLING). 90 tablet  0  . HYDROcodone-acetaminophen (NORCO/VICODIN) 5-325 MG tablet Take 1 tablet by mouth daily as needed.    Marland Kitchen lisinopril (ZESTRIL) 40 MG tablet Take 40 mg by mouth daily.    . metoprolol succinate (TOPROL-XL) 50 MG 24 hr tablet TAKE 1 TABLET BY MOUTH DAILY WITH FOOD OR IMMEDIATELY FOLLOWING MEAL 90 tablet 3  . omeprazole (PRILOSEC) 20 MG capsule Take 20 mg by mouth daily as needed.     No current facility-administered medications for this visit.   Allergies:   Penicillins and Sulfonamide derivatives   Social History: The patient  reports that she has never smoked. She has never used smokeless tobacco. She reports that she does not drink alcohol and does not use drugs.   Family History: The patient's family history includes Cancer in an other family member.   ROS:  Please see the history of present illness. Otherwise, complete review of systems is positive for none.  All other systems are reviewed and negative.   Physical Exam: VS:  BP 128/88   Pulse 70   Ht 5\' 3"  (1.6 m)   Wt 261 lb 12.8 oz (118.8 kg)   SpO2 98%   BMI 46.38 kg/m , BMI Body mass index is 46.38 kg/m.  Wt Readings from Last 3 Encounters:  06/09/20 261 lb 12.8 oz (118.8 kg)  10/08/19 244 lb (110.7 kg)  01/14/19 245 lb (111.1 kg)    General: Morbidly obese patient appears comfortable at rest. Neck: Supple, no elevated JVP or carotid bruits, no thyromegaly. Lungs: Clear to auscultation, nonlabored breathing at rest. Cardiac: Regular rate and rhythm, no S3 or significant systolic murmur, no pericardial rub. Extremities: No pitting edema, distal pulses 2+. Skin: Warm and dry.  Musculoskeletal: No kyphosis. Neuropsychiatric: Alert and oriented x3, affect grossly appropriate.  ECG:  An ECG dated 06/09/2020 was personally reviewed today and demonstrated:  Normal sinus rhythm with sinus arrhythmia rate 72  Recent Labwork: No results found for requested labs within last 8760 hours.  No results found for: CHOL, TRIG, HDL, CHOLHDL, VLDL, LDLCALC, LDLDIRECT  Other Studies Reviewed Today:  Echocardiogram 03/23/2014 - Left ventricle: The cavity size was normal. Wall thickness was  increased increased in a pattern of mild to moderate LVH.  Systolic function was vigorous. The estimated ejection fraction  was in the range of 65% to 70%. Wall motion was normal; there  were no regional wall motion abnormalities. Features are  consistent with a pseudonormal left ventricular  filling pattern,  with concomitant abnormal relaxation and increased filling  pressure (grade 2 diastolic dysfunction).  - Aortic valve: Valve area (VTI): 3.78 cm^2. Valve area (Vmax):  3.19 cm^2.  - Left atrium: The atrium was mildly to moderately dilated.  - Right atrium: The atrium was mildly dilated.  - Systemic veins: The IVC appears small, suggetive of low RA  pressure and hypovolemia.  - Technically adequate study.   Assessment and Plan:  1. Chronic diastolic heart failure (HCC)   2. Bilateral lower extremity edema   3. Morbid obesity (HCC)   4. Essential hypertension    1. Chronic diastolic heart failure (HCC) Denies any recent significant dyspnea on exertion.  States she has gained some weight since being on steroids after Covid infection last year.  States she gained approximately 25 pounds.  From March until today's weight it appears she has gained approximately 17 pounds.  Continue Lasix 20 mg as needed.  Patient states she is not tolerating lisinopril very well and would like to switch  to another medication.  Stop lisinopril 40 mg daily.  Start HCTZ 25 mg daily.  Get BMP and magnesium in 2 weeks.  Continue Toprol-XL 50 mg daily.  2. Bilateral lower extremity edema No lower extremity edema noted today.  Continue Lasix 20 mg as needed for lower extremity edema.  3. Morbid obesity (HCC) Needs weight loss.  Previously advised to start a weight loss program.  Recently has gained approximately 17 pounds since March.  4. Essential hypertension Blood pressure today 128/88.  Continue amlodipine 10 mg daily, start HCTZ 25 mg daily.   Medication Adjustments/Labs and Tests Ordered: Current medicines are reviewed at length with the patient today.  Concerns regarding medicines are outlined above.   Disposition: Follow-up with Dr. Wyline Mood or APP 3 months  Signed, Rennis Harding, NP 06/09/2020 10:18 AM    [facility] at [facility] [address] [place],  [place], [place] [address] Phone: [phone]; Fax: [phone]

## 2020-06-09 ENCOUNTER — Encounter: Payer: Self-pay | Admitting: Family Medicine

## 2020-06-09 ENCOUNTER — Encounter: Payer: Self-pay | Admitting: *Deleted

## 2020-06-09 ENCOUNTER — Ambulatory Visit (INDEPENDENT_AMBULATORY_CARE_PROVIDER_SITE_OTHER): Payer: 59 | Admitting: Family Medicine

## 2020-06-09 VITALS — BP 128/88 | HR 70 | Ht 63.0 in | Wt 261.8 lb

## 2020-06-09 DIAGNOSIS — I5032 Chronic diastolic (congestive) heart failure: Secondary | ICD-10-CM | POA: Diagnosis not present

## 2020-06-09 DIAGNOSIS — I10 Essential (primary) hypertension: Secondary | ICD-10-CM | POA: Diagnosis not present

## 2020-06-09 DIAGNOSIS — R600 Localized edema: Secondary | ICD-10-CM

## 2020-06-09 MED ORDER — HYDROCHLOROTHIAZIDE 25 MG PO TABS: 25.0000 mg | ORAL_TABLET | Freq: Every day | ORAL | 6 refills | Status: DC

## 2020-06-09 NOTE — Patient Instructions (Addendum)
Medication Instructions:   Stop Lisinopril.   Begin HCTZ 25mg  daily.   Continue all other medications.    Labwork:  BMET, Magnesium - orders given today.   Please do in 2 weeks (around 06/23/2020).  Office will contact with results via phone or letter.    Testing/Procedures: none  Follow-Up: 3 months   Any Other Special Instructions Will Be Listed Below (If Applicable).  If you need a refill on your cardiac medications before your next appointment, please call your pharmacy.

## 2020-07-10 ENCOUNTER — Telehealth: Payer: Self-pay | Admitting: Family Medicine

## 2020-07-10 DIAGNOSIS — I119 Hypertensive heart disease without heart failure: Secondary | ICD-10-CM

## 2020-07-10 DIAGNOSIS — I10 Essential (primary) hypertension: Secondary | ICD-10-CM

## 2020-07-10 NOTE — Telephone Encounter (Signed)
New message     Pt c/o medication issue:  1. Name of Medication: hydrochlorothiazide (HYDRODIURIL) 25 MG tablet  2. How are you currently taking this medication (dosage and times per day)? 1 tablet daily   3. Are you having a reaction (difficulty breathing--STAT)? no 4. What is your medication issue? Patient doesn't feel like this is controlling her bp very well, she would like to go back to the lisinopril   Leave message on voicemail she is at work

## 2020-07-10 NOTE — Telephone Encounter (Signed)
Cancel the last request. Send her to hypertension clinic. She has been on multiple antihypertensive medications. We are not likely to find one that will work given the multiple medication she has been on in the past. Make a referral to Hypertension Clinic please. Thank You

## 2020-07-10 NOTE — Telephone Encounter (Signed)
Pt says since starting HCTZ BP has been 140s/90s - doesn't think HCTZ is working for BP control - had problems with lisinopril as per LOV with Nena Polio, NP - denies any worsening swelling/SOB/headaches

## 2020-07-10 NOTE — Telephone Encounter (Signed)
Lets try Losartan 25 mg po daily and stop HCTZ

## 2020-07-11 NOTE — Telephone Encounter (Signed)
Pt agreeable to referral, will place orders and forward to schedulers

## 2020-07-11 NOTE — Telephone Encounter (Signed)
New message    Patient called back to give permission for you to speak to her husband about what [employer]

## 2020-08-24 ENCOUNTER — Encounter: Payer: Self-pay | Admitting: Cardiovascular Disease

## 2020-08-24 ENCOUNTER — Other Ambulatory Visit: Payer: Self-pay

## 2020-08-24 ENCOUNTER — Ambulatory Visit (INDEPENDENT_AMBULATORY_CARE_PROVIDER_SITE_OTHER): Payer: 59 | Admitting: Cardiovascular Disease

## 2020-08-24 VITALS — BP 134/90 | HR 75 | Ht 63.0 in | Wt 260.0 lb

## 2020-08-24 DIAGNOSIS — R600 Localized edema: Secondary | ICD-10-CM

## 2020-08-24 DIAGNOSIS — I5032 Chronic diastolic (congestive) heart failure: Secondary | ICD-10-CM

## 2020-08-24 DIAGNOSIS — R0602 Shortness of breath: Secondary | ICD-10-CM

## 2020-08-24 DIAGNOSIS — Z6841 Body Mass Index (BMI) 40.0 and over, adult: Secondary | ICD-10-CM

## 2020-08-24 DIAGNOSIS — I10 Essential (primary) hypertension: Secondary | ICD-10-CM

## 2020-08-24 DIAGNOSIS — I517 Cardiomegaly: Secondary | ICD-10-CM

## 2020-08-24 MED ORDER — VALSARTAN 320 MG PO TABS: 320.0000 mg | ORAL_TABLET | Freq: Every day | ORAL | 1 refills | Status: DC

## 2020-08-24 NOTE — Progress Notes (Signed)
Hypertension Clinic Initial Assessment:    Date:  08/24/2020   ID:  [name], DOB 1976-01-12, MRN [id number]  PCP:  Ardyth Man, MD  Cardiologist:  No primary care provider on file.  Nephrologist:  Referring MD: Netta Neat., NP   CC: Hypertension  History of Present Illness:    [name] is a 45 y.o. female with a hx of labile hypertension, chronic diastolic heart failure, and morbid obesity here to establish care in the hypertension clinic. She first had pre-eclampsia at age 28 and her BP improved after delivery.  This recurred when pregnant at 25 and her BP has been high since that time.  She notes that her blood pressure has been very labile.  She has noted bloating and fatigue that started when she started taking lisinopril.  She had COVID-19 pneumonia 06/2019.  She gained weight which she attributes to steroid use.  Since then her BP has been very difficult to control.  She feels like she is constantly swelling and tired.  If she takes Lasix she gets too dehydrated.  She notes the swelling mostly on her left leg and her abdomen.  At her last appointment Ms. Maestre switched lisinopril to HCTZ.  She had been added the lisinopril back because her blood pressure was not controlled.  She also tried hydralazine but did not tolerate it.  At home her blood pressure has ranged 140-150s/80-100.  Ms. Furnari notes that her diet has been poor.  She struggles with salt intake and eating out.  She does not have much caffeine and does not drink alcohol.  She does not get formal exercise but is on her feet and walks all day at work.  She has no exertional chest pain or shortness of breath.  She and her family have struggled with a lot of illness recently.  Her son had viral managed Zydus after his COVID-19 booster.  They all had upper respiratory infections over the years that they are just recovering from.  She does snore and does not feel well rested in the mornings.  She falls asleep easily  during the day.   Previous antihypertensives: Lisinopril- weight gain, shakiness Hydralazine- headache, palpitations   Past Medical History:  Diagnosis Date  . Anxiety   . Burning with urination   . Chronic diastolic CHF (congestive heart failure) (HCC)   . Constipation   . Coronary artery fistula    ??? by echo 08/2009../..cardiac CTA...09/05/2009...no coronary fistula  . Dizziness    Nonspecific, May, 2012, feeling off balance  . Ejection fraction < 50%    EF 35-40%...echo...january 27,2011/cardiac cta EF 51%...09/05/2009 LVH..moderate...echo january 2011  . Fluid overload    April, 2012  . Hypertension    severe,but very responsive to amlodipine and hydrochlorithizide  overweight  . LVH (left ventricular hypertrophy)    Moderate echo, January, 2011  . Migraine   . Obesity   . Palpitation   . Pulmonary hypertension (HCC)    60 mmHg echo January, 2011 / normal pulmonary arteries by CT angiography  . Shortness of breath   . Tricuspid regurgitation    moderate ...echo..january 2011  . UTI (urinary tract infection)    recurrent  . Weight gain    August, 2012    Past Surgical History:  Procedure Laterality Date  . CESAREAN SECTION    . CYSTOSCOPY/URETEROSCOPY/HOLMIUM LASER/STENT PLACEMENT Right 03/29/2017   Procedure: CYSTOSCOPY/URETEROSCOPY/HOLMIUM LASER/STENT PLACEMENT;  Surgeon: Sebastian Ache, MD;  Location: [facility];  Service: Urology;  Laterality: Right;  . TUBAL LIGATION      Current Medications: Current Meds  Medication Sig  . amLODipine (NORVASC) 10 MG tablet TAKE 1 TABLET BY MOUTH EVERY DAY  . hydrochlorothiazide (HYDRODIURIL) 25 MG tablet Take 25 mg by mouth as needed.  . metoprolol succinate (TOPROL-XL) 50 MG 24 hr tablet TAKE 1 TABLET BY MOUTH DAILY WITH FOOD OR IMMEDIATELY FOLLOWING MEAL  . valsartan (DIOVAN) 320 MG tablet Take 1 tablet (320 mg total) by mouth daily.  . [DISCONTINUED] hydrochlorothiazide (HYDRODIURIL) 25 MG tablet Take 1 tablet (25 mg total)  by mouth daily.  . [DISCONTINUED] HYDROcodone-acetaminophen (NORCO/VICODIN) 5-325 MG tablet Take 1 tablet by mouth daily as needed.  . [DISCONTINUED] lisinopril (ZESTRIL) 40 MG tablet      Allergies:   Penicillins and Sulfonamide derivatives   Social History   Socioeconomic History  . Marital status: Married    Spouse name: Not on file  . Number of children: 2  . Years of education: Not on file  . Highest education level: Not on file  Occupational History  . Occupation: full time    Employer: [employer]  Tobacco Use  . Smoking status: Never Smoker  . Smokeless tobacco: Never Used  Vaping Use  . Vaping Use: Never used  Substance and Sexual Activity  . Alcohol use: No    Alcohol/week: 0.0 standard drinks  . Drug use: No  . Sexual activity: Not on file  Other Topics Concern  . Not on file  Social History Narrative  . Not on file   Social Determinants of Health   Financial Resource Strain: Not on file  Food Insecurity: Not on file  Transportation Needs: Not on file  Physical Activity: Not on file  Stress: Not on file  Social Connections: Not on file     Family History: The patient's family history includes Cancer in an other family member; Hypertension in her mother; Leukemia in her father and mother.  ROS:   Please see the history of present illness.    All other systems reviewed and are negative.  EKGs/Labs/Other Studies Reviewed:    EKG:  EKG is not ordered today.    Recent Labs: No results found for requested labs within last 8760 hours.   Recent Lipid Panel No results found for: CHOL, TRIG, HDL, CHOLHDL, VLDL, LDLCALC, LDLDIRECT  Physical Exam:    VS:  BP 134/90   Pulse 75   Ht 5\' 3"  (1.6 m)   Wt 260 lb (117.9 kg)   SpO2 98%   BMI 46.06 kg/m  , BMI Body mass index is 46.06 kg/m. GENERAL:  Well appearing HEENT: Pupils equal round and reactive, fundi not visualized, oral mucosa unremarkable NECK:  No jugular venous distention, waveform within normal  limits, carotid upstroke brisk and symmetric, no bruits LUNGS:  Clear to auscultation bilaterally HEART:  RRR.  PMI not displaced or sustained,S1 and S2 within normal limits, no S3, no S4, no clicks, no rubs, no murmurs ABD:  Flat, positive bowel sounds normal in frequency in pitch, no bruits, no rebound, no guarding, no midline pulsatile mass, no hepatomegaly, no splenomegaly EXT:  2 plus pulses throughout, 1+ R LE edema, no cyanosis no clubbing SKIN:  No rashes no nodules NEURO:  Cranial nerves II through XII grossly intact, motor grossly intact throughout PSYCH:  Cognitively intact, oriented to person place and time   ASSESSMENT:    1. Essential hypertension   2. Bilateral edema of lower extremity   3. BMI  45.0-49.9, adult (HCC)   4. Chronic diastolic CHF (congestive heart failure) (HCC)   5. Primary hypertension   6. LVH (left ventricular hypertrophy)   7. Shortness of breath     PLAN:    # Essential hypertension:  BP above goal here and at home.  We will switch lisinopril to valsartan 320mg  daily due to side effects.  We discussed increasing her exercise to 150 minutes weekly.  She can't participate in PREP because she lives too far away.  We will refer her to a nutritionist to help with weight loss.  She will work on cooking at home more and limiting sodium to 1500 milligrams daily.  She was given information on the DASH diet and has been asked to check her blood pressure twice daily.  Secondary Causes of Hypertension  Medications/Herbal: OCP, steroids, stimulants, antidepressants, weight loss medication, immune suppressants, NSAIDs, sympathomimetics, alcohol, caffeine, licorice, ginseng, St. John's wort, chemo  Sleep Apnea- check sleep study Renal artery stenosis: Consider renal Dopplers Hyperaldosteronism Hyper/hypothyroidism: Check TSH Pheochromocytoma: (testing not indicated)  Cushing's syndrome: (testing not indicated)  Coarctation of the aorta: BP symmetric  #  Chronic diastolic heart failure: # LE Edema:  Ms. Beamon does not appear to be volume overloaded on exam today.  She does have 1+ right lower extremity edema.  I think this is due to venous stasis from walking all day at work.  Recommended that she get compression socks.  Her neck veins are not elevated.  We will check a BNP and a BMP in 1 week after starting the valsartan.  Repeat echocardiogram.  Prior echo showed grade 2 diastolic dysfunction and her IVC was not dilated.  # Fatigue: # Daytime somnolence: # Snoring:  Check TSH and sleep study.    Disposition:    FU with MD/PharmD in 1 month    Medication Adjustments/Labs and Tests Ordered: Current medicines are reviewed at length with the patient today.  Concerns regarding medicines are outlined above.  Orders Placed This Encounter  Procedures  . Basic metabolic panel  . B Nat Peptide  . TSH   Meds ordered this encounter  Medications  . valsartan (DIOVAN) 320 MG tablet    Sig: Take 1 tablet (320 mg total) by mouth daily.    Dispense:  90 tablet    Refill:  1    D/C LISINOPRIL     Signed, Thurmond Butts, MD  08/24/2020 1:35 PM    [facility]

## 2020-08-24 NOTE — Patient Instructions (Addendum)
Medication Instructions:  STOP LISINOPRIL   START VALSARTAN 320 MG DAILY   STOP FUROSEMIDE    Labwork: BMET/BNP/TSH 1 WEEK    Testing/Procedures: CONSIDER GETTING SLEEP STUDY WITH YOUR PRIMARY    Follow-Up: 09/29/2020 AT 11:30 AM WITH PHARM D   Referrals:    Will have someone reach out to you regarding referral to Nutritionist   Special Instructions:   TRY COMPRESSION SOCKS, MEDIUM STRENGTH   MONITOR YOUR BLOOD PRESSURE TWICE A DAY, LOG IN THE BOOK PROVIDED. BRING THE BOOK AND YOUR BLOOD PRESSURE MACHINE TO YOUR FOLLOW UP IN 1 MONTH    DASH Eating Plan DASH stands for "Dietary Approaches to Stop Hypertension." The DASH eating plan is a healthy eating plan that has been shown to reduce high blood pressure (hypertension). It may also reduce your risk for type 2 diabetes, heart disease, and stroke. The DASH eating plan may also help with weight loss. What are tips for following this plan?  General guidelines  Avoid eating more than 2,300 mg (milligrams) of salt (sodium) a day. If you have hypertension, you may need to reduce your sodium intake to 1,500 mg a day.  Limit alcohol intake to no more than 1 drink a day for nonpregnant women and 2 drinks a day for men. One drink equals 12 oz of beer, 5 oz of wine, or 1 oz of hard liquor.  Work with your health care provider to maintain a healthy body weight or to lose weight. Ask what an ideal weight is for you.  Get at least 30 minutes of exercise that causes your heart to beat faster (aerobic exercise) most days of the week. Activities may include walking, swimming, or biking.  Work with your health care provider or diet and nutrition specialist (dietitian) to adjust your eating plan to your individual calorie needs. Reading food labels   Check food labels for the amount of sodium per serving. Choose foods with less than 5 percent of the Daily Value of sodium. Generally, foods with less than 300 mg of sodium per serving fit into  this eating plan.  To find whole grains, look for the word "whole" as the first word in the ingredient list. Shopping  Buy products labeled as "low-sodium" or "no salt added."  Buy fresh foods. Avoid canned foods and premade or frozen meals. Cooking  Avoid adding salt when cooking. Use salt-free seasonings or herbs instead of table salt or sea salt. Check with your health care provider or pharmacist before using salt substitutes.  Do not fry foods. Cook foods using healthy methods such as baking, boiling, grilling, and broiling instead.  Cook with heart-healthy oils, such as olive, canola, soybean, or sunflower oil. Meal planning  Eat a balanced diet that includes: ? 5 or more servings of fruits and vegetables each day. At each meal, try to fill half of your plate with fruits and vegetables. ? Up to 6-8 servings of whole grains each day. ? Less than 6 oz of lean meat, poultry, or fish each day. A 3-oz serving of meat is about the same size as a deck of cards. One egg equals 1 oz. ? 2 servings of low-fat dairy each day. ? A serving of nuts, seeds, or beans 5 times each week. ? Heart-healthy fats. Healthy fats called Omega-3 fatty acids are found in foods such as flaxseeds and coldwater fish, like sardines, salmon, and mackerel.  Limit how much you eat of the following: ? Canned or prepackaged foods. ? Food  that is high in trans fat, such as fried foods. ? Food that is high in saturated fat, such as fatty meat. ? Sweets, desserts, sugary drinks, and other foods with added sugar. ? Full-fat dairy products.  Do not salt foods before eating.  Try to eat at least 2 vegetarian meals each week.  Eat more home-cooked food and less restaurant, buffet, and fast food.  When eating at a restaurant, ask that your food be prepared with less salt or no salt, if possible. What foods are recommended? The items listed may not be a complete list. Talk with your dietitian about what dietary  choices are best for you. Grains Whole-grain or whole-wheat bread. Whole-grain or whole-wheat pasta. Brown rice. [place]. Bulgur. Whole-grain and low-sodium cereals. Pita bread. Low-fat, low-sodium crackers. Whole-wheat flour tortillas. Vegetables Fresh or frozen vegetables (raw, steamed, roasted, or grilled). Low-sodium or reduced-sodium tomato and vegetable juice. Low-sodium or reduced-sodium tomato sauce and tomato paste. Low-sodium or reduced-sodium canned vegetables. Fruits All fresh, dried, or frozen fruit. Canned fruit in natural juice (without added sugar). Meat and other protein foods Skinless chicken or [place]. Ground chicken or [place]. Pork with fat trimmed off. Fish and seafood. Egg whites. Dried beans, peas, or lentils. Unsalted nuts, nut butters, and seeds. Unsalted canned beans. Lean cuts of beef with fat trimmed off. Low-sodium, lean deli meat. Dairy Low-fat (1%) or fat-free (skim) milk. Fat-free, low-fat, or reduced-fat cheeses. Nonfat, low-sodium ricotta or cottage cheese. Low-fat or nonfat yogurt. Low-fat, low-sodium cheese. Fats and oils Soft margarine without trans fats. Vegetable oil. Low-fat, reduced-fat, or light mayonnaise and salad dressings (reduced-sodium). Canola, safflower, olive, soybean, and sunflower oils. Avocado. Seasoning and other foods Herbs. Spices. Seasoning mixes without salt. Unsalted popcorn and pretzels. Fat-free sweets. What foods are not recommended? The items listed may not be a complete list. Talk with your dietitian about what dietary choices are best for you. Grains Baked goods made with fat, such as croissants, muffins, or some breads. Dry pasta or rice meal packs. Vegetables Creamed or fried vegetables. Vegetables in a cheese sauce. Regular canned vegetables (not low-sodium or reduced-sodium). Regular canned tomato sauce and paste (not low-sodium or reduced-sodium). Regular tomato and vegetable juice (not low-sodium or reduced-sodium).  Angie Fava. Olives. Fruits Canned fruit in a light or heavy syrup. Fried fruit. Fruit in cream or butter sauce. Meat and other protein foods Fatty cuts of meat. Ribs. Fried meat. Berniece Salines. Sausage. Bologna and other processed lunch meats. Salami. Fatback. Hotdogs. Bratwurst. Salted nuts and seeds. Canned beans with added salt. Canned or smoked fish. Whole eggs or egg yolks. Chicken or [place] with skin. Dairy Whole or 2% milk, cream, and half-and-half. Whole or full-fat cream cheese. Whole-fat or sweetened yogurt. Full-fat cheese. Nondairy creamers. Whipped toppings. Processed cheese and cheese spreads. Fats and oils Butter. Stick margarine. Lard. Shortening. Ghee. Bacon fat. Tropical oils, such as coconut, palm kernel, or palm oil. Seasoning and other foods Salted popcorn and pretzels. Onion salt, garlic salt, seasoned salt, table salt, and sea salt. Worcestershire sauce. Tartar sauce. Barbecue sauce. Teriyaki sauce. Soy sauce, including reduced-sodium. Steak sauce. Canned and packaged gravies. Fish sauce. Oyster sauce. Cocktail sauce. Horseradish that you find on the shelf. Ketchup. Mustard. Meat flavorings and tenderizers. Bouillon cubes. Hot sauce and Tabasco sauce. Premade or packaged marinades. Premade or packaged taco seasonings. Relishes. Regular salad dressings. Where to find more information:  National Heart, Lung, and [place]: https://wilson-eaton.com/  American Heart Association: www.heart.org Summary  The DASH eating plan is a  healthy eating plan that has been shown to reduce high blood pressure (hypertension). It may also reduce your risk for type 2 diabetes, heart disease, and stroke.  With the DASH eating plan, you should limit salt (sodium) intake to 2,300 mg a day. If you have hypertension, you may need to reduce your sodium intake to 1,500 mg a day.  When on the DASH eating plan, aim to eat more fresh fruits and vegetables, whole grains, lean proteins, low-fat dairy, and  heart-healthy fats.  Work with your health care provider or diet and nutrition specialist (dietitian) to adjust your eating plan to your individual calorie needs. This information is not intended to replace advice given to you by your health care provider. Make sure you discuss any questions you have with your health care provider. Document Released: 07/11/2011 Document Revised: 07/04/2017 Document Reviewed: 07/15/2016 Elsevier Patient Education  2020 [employer].

## 2020-09-07 NOTE — Progress Notes (Signed)
Cardiology Office Note  Date: 09/08/2020   ID: Kenidi Elenbaas, DOB 1976-01-05, MRN [id number]  PCP:  Ardyth Man, MD  Cardiologist:  No primary care provider on file. Electrophysiologist:  None   Chief Complaint: Cardiac follow up  History of Present Illness: [name] is a 45 y.o. female with a history of labile essential hypertension, chronic diastolic heart failure, morbid obesity, bilateral lower extremity edema, morbid obesity, LVH, shortness of breath.  Last encounter with Dr. Duke Salvia at the hypertension clinic on 08/26/2020.  Lasix was stopped.  Her lisinopril was switched to valsartan 320 mg daily due to side effects.  Increasing exercise to 150 minutes/week was discussed.  She was referred to her nutritionist to help with weight loss.  She was to work on cooking at home and limiting sodium to 1500 mg/day she was given information on DASH diet and asked to check her blood pressure twice daily.  Was recommended she get compression stockings for lower extremity edema thought to be due to venous stasis.  Follow-up lab work BNP/BMET/TSH  ordered for 1 week after starting valsartan.  Repeat echocardiogram was ordered.  Advised to get sleep study with primary care provider  secondary to fatigue, daytime somnolence, and snoring.  She is here for follow-up today.  She denies any recent issues.  She states when she saw Dr. Duke Salvia she was started on valsartan has a follow-up with a nutritionist and plans to get a sleep study in the near future.  Blood pressure is up today at 140/94.  Previous blood pressure on 08/24/2020 was 134/90 and on 06/09/2020 blood pressure was 128/88.  He states he has gained approximately 5 pounds recently since last visit.  She states Dr. Duke Salvia stopped her Lasix due to it affecting her kidneys.  She denies any current anginal or exertional symptoms, orthostatic symptoms, lower extremity edema, PND, orthopnea.   Past Medical History:  Diagnosis Date  . Anxiety    . Burning with urination   . Chronic diastolic CHF (congestive heart failure) (HCC)   . Constipation   . Coronary artery fistula    ??? by echo 08/2009../..cardiac CTA...09/05/2009...no coronary fistula  . Dizziness    Nonspecific, May, 2012, feeling off balance  . Ejection fraction < 50%    EF 35-40%...echo...january 27,2011/cardiac cta EF 51%...09/05/2009 LVH..moderate...echo january 2011  . Fluid overload    April, 2012  . Hypertension    severe,but very responsive to amlodipine and hydrochlorithizide  overweight  . LVH (left ventricular hypertrophy)    Moderate echo, January, 2011  . Migraine   . Obesity   . Palpitation   . Pulmonary hypertension (HCC)    60 mmHg echo January, 2011 / normal pulmonary arteries by CT angiography  . Shortness of breath   . Tricuspid regurgitation    moderate ...echo..january 2011  . UTI (urinary tract infection)    recurrent  . Weight gain    August, 2012    Past Surgical History:  Procedure Laterality Date  . CESAREAN SECTION    . CYSTOSCOPY/URETEROSCOPY/HOLMIUM LASER/STENT PLACEMENT Right 03/29/2017   Procedure: CYSTOSCOPY/URETEROSCOPY/HOLMIUM LASER/STENT PLACEMENT;  Surgeon: Sebastian Ache, MD;  Location: [facility];  Service: Urology;  Laterality: Right;  . TUBAL LIGATION      Current Outpatient Medications  Medication Sig Dispense Refill  . amLODipine (NORVASC) 10 MG tablet TAKE 1 TABLET BY MOUTH EVERY DAY 90 tablet 1  . hydrochlorothiazide (HYDRODIURIL) 25 MG tablet Take 25 mg by mouth as needed.    Marland Kitchen  metoprolol succinate (TOPROL-XL) 50 MG 24 hr tablet TAKE 1 TABLET BY MOUTH DAILY WITH FOOD OR IMMEDIATELY FOLLOWING MEAL 90 tablet 3  . omeprazole (PRILOSEC) 20 MG capsule Take 20 mg by mouth daily as needed.    . valsartan (DIOVAN) 320 MG tablet Take 1 tablet (320 mg total) by mouth daily. 90 tablet 1   No current facility-administered medications for this visit.   Allergies:  Penicillins and Sulfonamide derivatives   Social History:  The patient  reports that she has never smoked. She has never used smokeless tobacco. She reports that she does not drink alcohol and does not use drugs.   Family History: The patient's family history includes Cancer in an other family member; Hypertension in her mother; Leukemia in her father and mother.   ROS:  Please see the history of present illness. Otherwise, complete review of systems is positive for none.  All other systems are reviewed and negative.   Physical Exam: VS:  BP (!) 140/94   Pulse 84   Ht 5\' 3"  (1.6 m)   Wt 265 lb 9.6 oz (120.5 kg)   SpO2 98%   BMI 47.05 kg/m , BMI Body mass index is 47.05 kg/m.  Wt Readings from Last 3 Encounters:  09/08/20 265 lb 9.6 oz (120.5 kg)  08/24/20 260 lb (117.9 kg)  06/09/20 261 lb 12.8 oz (118.8 kg)    General: Patient appears comfortable at rest. Neck: Supple, no elevated JVP or carotid bruits, no thyromegaly. Lungs: Clear to auscultation, nonlabored breathing at rest. Cardiac: Regular rate and rhythm, no S3 or significant systolic murmur, no pericardial rub. Extremities: No pitting edema, distal pulses 2+. Skin: Warm and dry. Musculoskeletal: No kyphosis. Neuropsychiatric: Alert and oriented x3, affect grossly appropriate.  ECG:  EKG 06/09/2020 normal sinus rhythm with sinus arrhythmia rate of 72  Recent Labwork: No results found for requested labs within last 8760 hours.  No results found for: CHOL, TRIG, HDL, CHOLHDL, VLDL, LDLCALC, LDLDIRECT  Other Studies Reviewed Today:  Echocardiogram 03/23/2014 - Left ventricle: The cavity size was normal. Wall thickness was  increased increased in a pattern of mild to moderate LVH.  Systolic function was vigorous. The estimated ejection fraction  was in the range of 65% to 70%. Wall motion was normal; there  were no regional wall motion abnormalities. Features are  consistent with a pseudonormal left ventricular filling pattern,  with concomitant abnormal relaxation  and increased filling  pressure (grade 2 diastolic dysfunction).  - Aortic valve: Valve area (VTI): 3.78 cm^2. Valve area (Vmax):  3.19 cm^2.  - Left atrium: The atrium was mildly to moderately dilated.  - Right atrium: The atrium was mildly dilated.  - Systemic veins: The IVC appears small, suggetive of low RA  pressure and hypovolemia.  - Technically adequate study.   Assessment and Plan:  1. Essential hypertension   2. Chronic diastolic heart failure (HCC)   3. Bilateral lower extremity edema   4. Suspected sleep apnea    1. Essential hypertension Blood pressure elevated today at 140/94.  Recently saw Dr. 03/25/2014 at hypertension clinic and was started on valsartan 320 mg daily.  Continue valsartan 320 mg daily.  Continue Toprol-XL 50 mg daily.  Continue HCTZ 25 mg as needed.  Continue amlodipine 10 mg daily.  She recently had BMP, BNP and TSH drawn at local lab.  We do not have those results as of yet.  Will call patient when we receive the results.  2. Chronic diastolic heart  failure [facility]) Denies any current dyspnea, PND, orthopnea or lower extremity edema.  Continue valsartan 320 mg daily.  HCTZ 25 mg p.o. as needed.  Toprol-XL 50 mg p.o. daily.  3. Bilateral lower extremity edema No lower extremity edema noted today.  Continue HCTZ 25 mg as needed.  4. Suspected sleep apnea Has plans to get a sleep study.  Medication Adjustments/Labs and Tests Ordered: Current medicines are reviewed at length with the patient today.  Concerns regarding medicines are outlined above.   Disposition: Follow-up with Dr. Wyline Mood or APP 6 months  Signed, Rennis Harding, NP 09/08/2020 10:04 AM    [facility] [address] [place], [place], [place] [address] Phone: [phone]; Fax: [phone]

## 2020-09-08 ENCOUNTER — Encounter: Payer: Self-pay | Admitting: *Deleted

## 2020-09-08 ENCOUNTER — Ambulatory Visit (INDEPENDENT_AMBULATORY_CARE_PROVIDER_SITE_OTHER): Payer: 59 | Admitting: Family Medicine

## 2020-09-08 ENCOUNTER — Encounter: Payer: Self-pay | Admitting: Family Medicine

## 2020-09-08 VITALS — BP 140/94 | HR 84 | Ht 63.0 in | Wt 265.6 lb

## 2020-09-08 DIAGNOSIS — R29818 Other symptoms and signs involving the nervous system: Secondary | ICD-10-CM

## 2020-09-08 DIAGNOSIS — I5032 Chronic diastolic (congestive) heart failure: Secondary | ICD-10-CM | POA: Diagnosis not present

## 2020-09-08 DIAGNOSIS — R600 Localized edema: Secondary | ICD-10-CM

## 2020-09-08 DIAGNOSIS — I10 Essential (primary) hypertension: Secondary | ICD-10-CM

## 2020-09-08 NOTE — Patient Instructions (Signed)

## 2020-09-21 ENCOUNTER — Telehealth: Payer: Self-pay | Admitting: *Deleted

## 2020-09-21 NOTE — Telephone Encounter (Signed)
Left message to return call 

## 2020-09-21 NOTE — Telephone Encounter (Signed)
-----   Message from Netta Neat., NP sent at 09/12/2020  1:08 PM EST ----- Recent lab work ordered by Dr. Duke Salvia looks good.

## 2020-09-25 NOTE — Telephone Encounter (Signed)
Lesle Chris, LPN  7/61/6073 7:10 AM EST Back to Top     Patient notified, copy to pcp.

## 2020-09-29 ENCOUNTER — Ambulatory Visit: Payer: 59

## 2020-12-29 ENCOUNTER — Ambulatory Visit: Payer: 59 | Admitting: Cardiology

## 2021-01-02 ENCOUNTER — Other Ambulatory Visit: Payer: Self-pay | Admitting: *Deleted

## 2021-01-02 MED ORDER — METOPROLOL SUCCINATE ER 50 MG PO TB24: ORAL_TABLET | ORAL | 1 refills | Status: DC

## 2021-02-22 ENCOUNTER — Ambulatory Visit (INDEPENDENT_AMBULATORY_CARE_PROVIDER_SITE_OTHER): Payer: 59 | Admitting: Cardiology

## 2021-02-22 ENCOUNTER — Encounter: Payer: Self-pay | Admitting: *Deleted

## 2021-02-22 ENCOUNTER — Other Ambulatory Visit: Payer: Self-pay | Admitting: Cardiovascular Disease

## 2021-02-22 ENCOUNTER — Encounter: Payer: Self-pay | Admitting: Cardiology

## 2021-02-22 VITALS — BP 140/90 | HR 88 | Ht 63.0 in | Wt 267.2 lb

## 2021-02-22 DIAGNOSIS — I5032 Chronic diastolic (congestive) heart failure: Secondary | ICD-10-CM

## 2021-02-22 DIAGNOSIS — I10 Essential (primary) hypertension: Secondary | ICD-10-CM

## 2021-02-22 MED ORDER — CHLORTHALIDONE 25 MG PO TABS: 25.0000 mg | ORAL_TABLET | Freq: Every day | ORAL | 6 refills | Status: AC

## 2021-02-22 NOTE — Patient Instructions (Addendum)
Medication Instructions:  Stop Valsartan (Diovan). Stop HCTZ (Hydrochlorothiazide).  Begin Chlorthalidone 25mg  daily.  Continue all other medications.    Labwork: BMET, BNP, Mg - orders given today.  Please do in 2 weeks (around 03/08/21).   Office will contact with results via phone or letter.    Testing/Procedures: none  Follow-Up: 3 months   Any Other Special Instructions Will Be Listed Below (If Applicable). Call the office in 2 weeks with update.    If you need a refill on your cardiac medications before your next appointment, please call your pharmacy.

## 2021-02-22 NOTE — Progress Notes (Signed)
Clinical Summary [name] is a 45 y.o.female former patient of Dr Purvis Sheffield, this is our first visit together.   1.HTN  -losartan led to headaches - she reported lisinopril led to weight gain - reports on valsartan has had weight gain - just taking HCTZ as needed - reports some fatigue on lasix in the past.     2. Chronic diastolic HF - 03/2014 echo LVEF 65-70% - prior LV dysfunction which later normalized.   - ongoign LE edema   3. Weight gain - increasing weight gain,  - some recent LE edema  4. OSA screen - has not done sleep study yet.     Brother passed 3 week  ago.   Past Medical History:  Diagnosis Date   Anxiety    Burning with urination    Chronic diastolic CHF (congestive heart failure) (HCC)    Constipation    Coronary artery fistula    ??? by echo 08/2009../..cardiac CTA...09/05/2009...no coronary fistula   Dizziness    Nonspecific, May, 2012, feeling off balance   Ejection fraction < 50%    EF 35-40%...echo...january 27,2011/cardiac cta EF 51%...09/05/2009 LVH..moderate...echo january 2011   Fluid overload    April, 2012   Hypertension    severe,but very responsive to amlodipine and hydrochlorithizide  overweight   LVH (left ventricular hypertrophy)    Moderate echo, January, 2011   Migraine    Obesity    Palpitation    Pulmonary hypertension (HCC)    60 mmHg echo January, 2011 / normal pulmonary arteries by CT angiography   Shortness of breath    Tricuspid regurgitation    moderate ...echo..january 2011   UTI (urinary tract infection)    recurrent   Weight gain    August, 2012     Allergies  Allergen Reactions   Penicillins    Sulfonamide Derivatives      Current Outpatient Medications  Medication Sig Dispense Refill   amLODipine (NORVASC) 10 MG tablet TAKE 1 TABLET BY MOUTH EVERY DAY 90 tablet 1   hydrochlorothiazide (HYDRODIURIL) 25 MG tablet Take 25 mg by mouth as needed.     metoprolol succinate (TOPROL-XL) 50 MG 24 hr  tablet TAKE 1 TABLET BY MOUTH DAILY WITH FOOD OR IMMEDIATELY FOLLOWING MEAL 90 tablet 1   omeprazole (PRILOSEC) 20 MG capsule Take 20 mg by mouth daily as needed.     valsartan (DIOVAN) 320 MG tablet TAKE 1 TABLET BY MOUTH EVERY DAY 90 tablet 2   No current facility-administered medications for this visit.     Past Surgical History:  Procedure Laterality Date   CESAREAN SECTION     CYSTOSCOPY/URETEROSCOPY/HOLMIUM LASER/STENT PLACEMENT Right 03/29/2017   Procedure: CYSTOSCOPY/URETEROSCOPY/HOLMIUM LASER/STENT PLACEMENT;  Surgeon: Sebastian Ache, MD;  Location: [facility];  Service: Urology;  Laterality: Right;   TUBAL LIGATION       Allergies  Allergen Reactions   Penicillins    Sulfonamide Derivatives       Family History  Problem Relation Age of Onset   Cancer Other    Hypertension Mother    Leukemia Mother    Leukemia Father      Social History [name] reports that she has never smoked. She has never used smokeless tobacco. [name] reports no history of alcohol use.   Review of Systems CONSTITUTIONAL: No weight loss, fever, chills, weakness or fatigue.  HEENT: Eyes: No visual loss, blurred vision, double vision or yellow sclerae.No hearing loss, sneezing, congestion, runny nose or sore throat.  SKIN: No rash or itching.  CARDIOVASCULAR: per hpi RESPIRATORY: No shortness of breath, cough or sputum.  GASTROINTESTINAL: No anorexia, nausea, vomiting or diarrhea. No abdominal pain or blood.  GENITOURINARY: No burning on urination, no polyuria NEUROLOGICAL: No headache, dizziness, syncope, paralysis, ataxia, numbness or tingling in the extremities. No change in bowel or bladder control.  MUSCULOSKELETAL: No muscle, back pain, joint pain or stiffness.  LYMPHATICS: No enlarged nodes. No history of splenectomy.  PSYCHIATRIC: No history of depression or anxiety.  ENDOCRINOLOGIC: No reports of sweating, cold or heat intolerance. No polyuria or polydipsia.  Marland Kitchen   Physical  Examination Today's Vitals   02/22/21 1507  BP: 140/90  Pulse: 88  SpO2: 98%  Weight: 267 lb 3.2 oz (121.2 kg)  Height: 5\' 3"  (1.6 m)   Body mass index is 47.33 kg/m.  Gen: resting comfortably, no acute distress HEENT: no scleral icterus, pupils equal round and reactive, no palptable cervical adenopathy,  CV: RRR, no m/r/g no jvd Resp: Clear to auscultation bilaterally GI: abdomen is soft, non-tender, non-distended, normal bowel sounds, no hepatosplenomegaly MSK: extremities are warm, no edema.  Skin: warm, no rash Neuro:  no focal deficits Psych: appropriate affect     Assessment and Plan  1.HTN - medication side effects as reproted above - d/c valsartan due to reported side effects. D/c her prn HCTZ and start chlorthalidone 25mg  daily. Check bmet/mg/bnp in 2 weeks. She will udpate on her home bp's in 2 weeks.  - could consider aldactone as next option.   2. Chronic diastolic HF - some weight gain and edema Starting chlorthalidone as reported above  3. OSA screen - dealing with recent loss of her brother, holding on sleep study but consider in the future   F/u  3 months    , M.D.

## 2021-02-22 NOTE — Telephone Encounter (Signed)
Rx(s) sent to pharmacy electronically.  

## 2021-03-08 ENCOUNTER — Ambulatory Visit: Payer: 59 | Admitting: Cardiology

## 2021-05-25 ENCOUNTER — Ambulatory Visit: Payer: 59 | Admitting: Cardiology

## 2021-06-22 ENCOUNTER — Ambulatory Visit: Payer: 59 | Admitting: Cardiology

## 2021-07-04 ENCOUNTER — Other Ambulatory Visit: Payer: Self-pay | Admitting: Family Medicine

## 2021-08-28 ENCOUNTER — Ambulatory Visit: Payer: 59 | Admitting: Cardiology

## 2021-11-13 ENCOUNTER — Encounter: Payer: Self-pay | Admitting: *Deleted

## 2021-11-16 ENCOUNTER — Ambulatory Visit: Payer: 59 | Admitting: Cardiology

## 2021-11-16 NOTE — Progress Notes (Deleted)
? ? ? ?Clinical Summary ?[name] is a 46 y.o.female ? ?1.HTN ?  ?-losartan led to headaches ?- she reported lisinopril led to weight gain ?- reports on valsartan has had weight gain ?- just taking HCTZ as needed ?- reports some fatigue on lasix in the past.  ?  ?Last visit: ?d/c valsartan due to reported side effects. D/c her prn HCTZ and start chlorthalidone 25mg  daily. Check bmet/mg/bnp in 2 weeks. She will udpate on her home bp's in 2 weeks.  ?- could consider aldactone as next option.  ?  ?  ?2. Chronic diastolic HF ?- 03/2014 echo LVEF 65-70% ?- prior LV dysfunction which later normalized.  ?  ?- ongoign LE edema ?  ?  ?3. Weight gain ?- increasing weight gain,  ?- some recent LE edema ?  ?4. OSA screen ?- has not done sleep study yet.  ?  ?  ? ?Past Medical History:  ?Diagnosis Date  ? Anxiety   ? Burning with urination   ? Chronic diastolic CHF (congestive heart failure) (HCC)   ? Constipation   ? Coronary artery fistula   ? ??? by echo 08/2009../..cardiac CTA...09/05/2009...no coronary fistula  ? Dizziness   ? Nonspecific, May, 2012, feeling off balance  ? Ejection fraction < 50%   ? EF 35-40%...echo...january 27,2011/cardiac cta EF 51%...09/05/2009 LVH..moderate...echo january 2011  ? Fluid overload   ? April, 2012  ? Hypertension   ? severe,but very responsive to amlodipine and hydrochlorithizide  overweight  ? LVH (left ventricular hypertrophy)   ? Moderate echo, January, 2011  ? Migraine   ? Obesity   ? Palpitation   ? Pulmonary hypertension (HCC)   ? 60 mmHg echo January, 2011 / normal pulmonary arteries by CT angiography  ? Shortness of breath   ? Tricuspid regurgitation   ? moderate ...echo..january 2011  ? UTI (urinary tract infection)   ? recurrent  ? Weight gain   ? August, 2012  ? ? ? ?Allergies  ?Allergen Reactions  ? Penicillins   ? Sulfonamide Derivatives   ? ? ? ?Current Outpatient Medications  ?Medication Sig Dispense Refill  ? amLODipine (NORVASC) 10 MG tablet TAKE 1 TABLET BY MOUTH EVERY DAY  90 tablet 1  ? chlorthalidone (HYGROTON) 25 MG tablet Take 1 tablet (25 mg total) by mouth daily. 30 tablet 6  ? metoprolol succinate (TOPROL-XL) 50 MG 24 hr tablet TAKE 1 TABLET BY MOUTH DAILY WITH FOOD OR IMMEDIATELY FOLLOWING MEAL 90 tablet 1  ? omeprazole (PRILOSEC) 20 MG capsule Take 20 mg by mouth daily as needed.    ? ?No current facility-administered medications for this visit.  ? ? ? ?Past Surgical History:  ?Procedure Laterality Date  ? CESAREAN SECTION    ? CYSTOSCOPY/URETEROSCOPY/HOLMIUM LASER/STENT PLACEMENT Right 03/29/2017  ? Procedure: CYSTOSCOPY/URETEROSCOPY/HOLMIUM LASER/STENT PLACEMENT;  Surgeon: 03/31/2017, MD;  Location: [facility];  Service: Urology;  Laterality: Right;  ? TUBAL LIGATION    ? ? ? ?Allergies  ?Allergen Reactions  ? Penicillins   ? Sulfonamide Derivatives   ? ? ? ? ?Family History  ?Problem Relation Age of Onset  ? Cancer Other   ? Hypertension Mother   ? Leukemia Mother   ? Leukemia Father   ? ? ? ?Social History ?Ms. Dubach reports that she has never smoked. She has never used smokeless tobacco. ?Ms. Balazs reports no history of alcohol use. ? ? ?Review of Systems ?CONSTITUTIONAL: No weight loss, fever, chills, weakness or fatigue.  ?HEENT: Eyes:  No visual loss, blurred vision, double vision or yellow sclerae.No hearing loss, sneezing, congestion, runny nose or sore throat.  ?SKIN: No rash or itching.  ?CARDIOVASCULAR:  ?RESPIRATORY: No shortness of breath, cough or sputum.  ?GASTROINTESTINAL: No anorexia, nausea, vomiting or diarrhea. No abdominal pain or blood.  ?GENITOURINARY: No burning on urination, no polyuria ?NEUROLOGICAL: No headache, dizziness, syncope, paralysis, ataxia, numbness or tingling in the extremities. No change in bowel or bladder control.  ?MUSCULOSKELETAL: No muscle, back pain, joint pain or stiffness.  ?LYMPHATICS: No enlarged nodes. No history of splenectomy.  ?PSYCHIATRIC: No history of depression or anxiety.  ?ENDOCRINOLOGIC: No reports of sweating, cold  or heat intolerance. No polyuria or polydipsia.  ?. ? ? ?Physical Examination ?There were no vitals filed for this visit. ?There were no vitals filed for this visit. ? ?Gen: resting comfortably, no acute distress ?HEENT: no scleral icterus, pupils equal round and reactive, no palptable cervical adenopathy,  ?CV ?Resp: Clear to auscultation bilaterally ?GI: abdomen is soft, non-tender, non-distended, normal bowel sounds, no hepatosplenomegaly ?MSK: extremities are warm, no edema.  ?Skin: warm, no rash ?Neuro:  no focal deficits ?Psych: appropriate affect ? ? ?Diagnostic Studies ? ? ? ? ?Assessment and Plan  ? ?1.HTN ?- medication side effects as reproted above ?- d/c valsartan due to reported side effects. D/c her prn HCTZ and start chlorthalidone 25mg  daily. Check bmet/mg/bnp in 2 weeks. She will udpate on her home bp's in 2 weeks.  ?- could consider aldactone as next option.  ?  ?2. Chronic diastolic HF ?- some weight gain and edema ?Starting chlorthalidone as reported above ?  ?3. OSA screen ?- dealing with recent loss of her brother, holding on sleep study but consider in the future ?  ? ? ? ? ?[place], M.D., F.A.C.C. ?

## 2022-05-29 ENCOUNTER — Other Ambulatory Visit: Payer: Self-pay | Admitting: Cardiovascular Disease

## 2022-07-30 ENCOUNTER — Other Ambulatory Visit: Payer: Self-pay | Admitting: Cardiovascular Disease
